# Patient Record
Sex: Female | Born: 1975
Health system: Southern US, Community
[De-identification: ages and names within clinical notes are randomized; demographics above are authoritative.]

## PROBLEM LIST (undated history)

## (undated) DIAGNOSIS — N739 Female pelvic inflammatory disease, unspecified: Secondary | ICD-10-CM

## (undated) DIAGNOSIS — K219 Gastro-esophageal reflux disease without esophagitis: Secondary | ICD-10-CM

## (undated) DIAGNOSIS — E668 Other obesity: Secondary | ICD-10-CM

## (undated) DIAGNOSIS — E669 Obesity, unspecified: Secondary | ICD-10-CM

## (undated) DIAGNOSIS — N946 Dysmenorrhea, unspecified: Secondary | ICD-10-CM

## (undated) HISTORY — DX: Obesity, unspecified: E66.9

## (undated) HISTORY — PX: WISDOM TOOTH EXTRACTION: SHX21

## (undated) HISTORY — PX: CHOLECYSTECTOMY: SHX55

## (undated) HISTORY — DX: Other obesity: E66.8

---

## 2003-05-09 ENCOUNTER — Encounter: Admission: RE | Admit: 2003-05-09 | Discharge: 2003-05-09 | Payer: Self-pay | Admitting: Obstetrics and Gynecology

## 2003-05-09 ENCOUNTER — Encounter (INDEPENDENT_AMBULATORY_CARE_PROVIDER_SITE_OTHER): Payer: Self-pay | Admitting: Specialist

## 2003-05-09 ENCOUNTER — Other Ambulatory Visit: Admission: RE | Admit: 2003-05-09 | Discharge: 2003-05-09 | Payer: Self-pay | Admitting: Obstetrics and Gynecology

## 2003-05-09 ENCOUNTER — Encounter (INDEPENDENT_AMBULATORY_CARE_PROVIDER_SITE_OTHER): Payer: Self-pay

## 2005-07-14 ENCOUNTER — Emergency Department (HOSPITAL_COMMUNITY): Admission: EM | Admit: 2005-07-14 | Discharge: 2005-07-15 | Payer: Self-pay | Admitting: Emergency Medicine

## 2005-07-26 ENCOUNTER — Encounter: Admission: RE | Admit: 2005-07-26 | Discharge: 2005-07-26 | Payer: Self-pay | Admitting: *Deleted

## 2005-09-29 ENCOUNTER — Encounter: Payer: Self-pay | Admitting: Cardiology

## 2005-09-29 ENCOUNTER — Ambulatory Visit: Payer: Self-pay

## 2007-04-24 ENCOUNTER — Emergency Department (HOSPITAL_COMMUNITY): Admission: EM | Admit: 2007-04-24 | Discharge: 2007-04-24 | Payer: Self-pay | Admitting: Emergency Medicine

## 2010-09-27 NOTE — Group Therapy Note (Signed)
NAME:  Latoya Brooks, Latoya Brooks                            ACCOUNT NO.:  [id number]   MEDICAL RECORD NO.:  [id number]                   PATIENT TYPE:  OUT   LOCATION:  [facility]                           FACILITY:  [facility]   PHYSICIAN:  Argentina Donovan, MD                     DATE OF BIRTH:  [birth year]   DATE OF SERVICE:                                    CLINIC NOTE   The patient was here for colposcopy.  She is a 35 year old, gravida 3 para 2-  0-1-2, who had normal cytology in 2002 and 04/2001 she had an ASCUS which  was followed in 06/2002 by an LSIL and a colposcopy.  At that time, they  told her to repeat the Pap smear in six months which she did, and at this  point she has ASC-H (atypical squamous cell _________ high grade squamous  intraepithelial) lesion, and was sent to [place] for re-colposcopy.  The patient  is a smoker of one pack of cigarettes a day.  We discussed stopping.   VITAL SIGNS:  She is 5 foot 6, weighs 230 pounds.  She has a blood pressure  of 121/75 and a pulse of 80.   MEDICATIONS:  She is on Depo-Provera for contraception.   Colposcopy was done under which endocervical curettage was taken as well as  biopsies at 11, 12 and 1 o'clock.  The cervix was treated with 3% acetic  acid and it was a satisfactory colposcopy where the transition cell was seen  at 360 degrees.  There were acetowhitening changes around the entire cervix  with mild fine mosaic changes at 12 o'clock and 11 o'clock.  No punctations  and no atypical vessels were visualized.   IMPRESSION:  Cervical intraepithelial neoplasia-1.   We have told the patient we will call her with the results when they return.                                               Argentina Donovan, MD    PR/MEDQ  D:  05/09/2003  T:  05/09/2003  Job:  [id number]

## 2011-11-05 ENCOUNTER — Encounter (HOSPITAL_COMMUNITY): Payer: Self-pay

## 2011-11-05 ENCOUNTER — Emergency Department (HOSPITAL_COMMUNITY)
Admission: EM | Admit: 2011-11-05 | Discharge: 2011-11-05 | Disposition: A | Discharge status: Home / Self Care | Payer: 59 | Source: Home / Self Care

## 2011-11-05 DIAGNOSIS — J42 Unspecified chronic bronchitis: Secondary | ICD-10-CM

## 2011-11-05 DIAGNOSIS — F17200 Nicotine dependence, unspecified, uncomplicated: Secondary | ICD-10-CM

## 2011-11-05 DIAGNOSIS — K219 Gastro-esophageal reflux disease without esophagitis: Secondary | ICD-10-CM

## 2011-11-05 DIAGNOSIS — R0789 Other chest pain: Secondary | ICD-10-CM

## 2011-11-05 LAB — POCT I-STAT, CHEM 8
BUN: 8 mg/dL (ref 6–23)
Calcium, Ion: 1.16 mmol/L (ref 1.12–1.32)
Chloride: 107 meq/L (ref 96–112)
Creatinine, Ser: 0.7 mg/dL (ref 0.50–1.10)
Glucose, Bld: 81 mg/dL (ref 70–99)
HCT: 44 % (ref 36.0–46.0)
Hemoglobin: 15 g/dL (ref 12.0–15.0)
Potassium: 4.1 meq/L (ref 3.5–5.1)
Sodium: 140 meq/L (ref 135–145)
TCO2: 21 mmol/L (ref 0–100)

## 2011-11-05 MED ORDER — PANTOPRAZOLE SODIUM 40 MG PO TBEC: 40.0000 mg | DELAYED_RELEASE_TABLET | Freq: Every day | ORAL | Status: DC

## 2011-11-05 MED ORDER — NICOTINE 14 MG/24HR TD PT24: 1.0000 | MEDICATED_PATCH | TRANSDERMAL | Status: AC

## 2011-11-05 MED ORDER — NICOTINE 7 MG/24HR TD PT24: 1.0000 | MEDICATED_PATCH | TRANSDERMAL | Status: AC

## 2011-11-05 NOTE — Discharge Instructions (Signed)
Gastritis Gastritis is an inflammation (the body's way of reacting to injury and/or infection) of the stomach. It is often caused by viral or bacterial (germ) infections. It can also be caused by chemicals (including alcohol) and medications. This illness may be associated with generalized malaise (feeling tired, not well), cramps, and fever. The illness may last 2 to 7 days. If symptoms of gastritis continue, gastroscopy (looking into the stomach with a telescope-like instrument), biopsy (taking tissue samples), and/or blood tests may be necessary to determine the cause. Antibiotics will not affect the illness unless there is a bacterial infection present. One common bacterial cause of gastritis is an organism known as H. Pylori. This can be treated with antibiotics. Other forms of gastritis are caused by too much acid in the stomach. They can be treated with medications such as H2 blockers and antacids. Home treatment is usually all that is needed. Young children will quickly become dehydrated (loss of body fluids) if vomiting and diarrhea are both present. Medications may be given to control nausea. Medications are usually not given for diarrhea unless especially bothersome. Some medications slow the removal of the virus from the gastrointestinal tract. This slows down the healing process. HOME CARE INSTRUCTIONS Home care instructions for nausea and vomiting:  For adults: drink small amounts of fluids often. Drink at least 2 quarts a day. Take sips frequently. Do not drink large amounts of fluid at one time. This may worsen the nausea.   Only take over-the-counter or prescription medicines for pain, discomfort, or fever as directed by your caregiver.   Drink clear liquids only. Those are anything you can see through such as water, broth, or soft drinks.   Once you are keeping clear liquids down, you may start full liquids, soups, juices, and ice cream or sherbet. Slowly add bland (plain, not spicy)  foods to your diet.  Home care instructions for diarrhea:  Diarrhea can be caused by bacterial infections or a virus. Your condition should improve with time, rest, fluids, and/or anti-diarrheal medication.   Until your diarrhea is under control, you should drink clear liquids often in small amounts. Clear liquids include: water, broth, jell-o water and weak tea.  Avoid:  Milk.   Fruits.   Tobacco.   Alcohol.   Extremely hot or cold fluids.   Too much intake of anything at one time.  When your diarrhea stops you may add the following foods, which help the stool to become more formed:  Rice.   Bananas.   Apples without skin.   Dry toast.  Once these foods are tolerated you may add low-fat yogurt and low-fat cottage cheese. They will help to restore the normal bacterial balance in your bowel. Wash your hands well to avoid spreading bacteria (germ) or virus. SEEK IMMEDIATE MEDICAL CARE IF:   You are unable to keep fluids down.   Vomiting or diarrhea become persistent (constant).   Abdominal pain develops, increases, or localizes. (Right sided pain can be appendicitis. Left sided pain in adults can be diverticulitis.)   You develop a fever (an oral temperature above 102 F (38.9 C)).   Diarrhea becomes excessive or contains blood or mucus.   You have excessive weakness, dizziness, fainting or extreme thirst.   You are not improving or you are getting worse.   You have any other questions or concerns.  Document Released: 04/22/2001 Document Revised: 04/17/2011 Document Reviewed: 04/28/2005 [facility] Patient Information 2012 [place], [place].Chronic Obstructive Pulmonary Disease Exacerbation  Chronic obstructive pulmonary disease (  COPD) is a lung disease. The lungs become damaged, making it hard to get air in and out of your lungs. COPD exacerbation means that your COPD has gotten worse. If you do not get help, this can be a life-threatening problem. HOME CARE  Do not  smoke.   Avoid tobacco smoke and other things that bother your lungs.   If given, take your antibiotic medicine as told. Finish the medicine even if you start to feel better.   Only take medicines as told by your doctor.   Drink enough fluids to keep your pee (urine) clear or pale yellow.   Use a cool mist machine (vaporizer).   If you use oxygen or a machine that turns liquid medicine into a mist (nebulizer), continue to use them as told.   Keep up with shots (vaccinations) as told by your doctor.   Exercise regularly.   Eat healthy foods.   Keep all doctor visits as told.  GET HELP RIGHT AWAY IF:  You are very short of breath.   You have trouble talking.   You have bad chest pain.   You have blood in your spit (sputum).   You have a fever, or you keep throwing up (vomiting).   You feel weak, or you pass out (faint).   You feel confused.   You keep getting worse.  MAKE SURE YOU:   Understand these instructions.   Will watch your condition.   Will get help right away if you are not doing well or get worse.  Document Released: 04/17/2011 Document Reviewed: 04/15/2011 [facility] Patient Information 2012 [place], [place].

## 2011-11-05 NOTE — ED Notes (Signed)
3-4 week duration of cough w clear/yellow secretions ; hard sometimes to breathe, cannot "keep " breath (comes and goes ) concerned she may be having a panic attack attack; ?gas, indigestion?, "I want to be checked out"; NAD

## 2011-11-05 NOTE — ED Provider Notes (Signed)
History     CSN: [id number]  Arrival date & time 11/05/11  1125   First MD Initiated Contact with Patient 11/05/11 1127      Chief Complaint  Patient presents with  . Cough     HPI  Left chest ache for 3 weeks on an off. Often occuring at rest, improves when she coughs and clears the congestion in her throat. Does not occur on exertion- walks regularly and walked 4 miles yesterday without pain. 4/10 in severity. No associated nausea, diaphoresis or dyspnea. Feels like her heart may be beating fast but has not checked.  No radiation. Usually does not last more than a few minutes but lasted for 15 min today and therefore she came to be evaluated. She felt anxious and was crying.  She has had more cough w/ congestion and clear mucous lately. (smokes)  History reviewed. No pertinent past medical history.  Past Surgical History  Procedure Date  . Cholecystectomy     History reviewed. No pertinent family history.  History  Substance Use Topics  . Smoking status: Current Some Day Smoker- 1/2 ppd for about 20 yrs.   . Smokeless tobacco: Not on file  . Alcohol Use: Yes    OB History    Grav Para Term Preterm Abortions TAB SAB Ect Mult Living                  Review of Systems  Constitutional: Negative for chills, diaphoresis, appetite change and fatigue.  HENT: Negative for hearing loss, ear pain, nosebleeds, congestion, rhinorrhea, sneezing, neck stiffness, postnasal drip, tinnitus and ear discharge.   Eyes: Negative.   Respiratory: Positive for cough and chest tightness. Negative for choking, shortness of breath, wheezing and stridor.   Cardiovascular: Positive for chest pain and palpitations. Negative for leg swelling.       Left chest tightness, once specific spot next to sternum  Gastrointestinal: Negative for nausea, abdominal pain, diarrhea, constipation and abdominal distention.       Heartburn, this chest pain feels better after burping.  Digestive problems since  she had her gall bladder removed. Need to drink a carbonated liquid at meals to prevent severe heart burn. At times has had reflux.  Has tried tums.   Genitourinary: Negative.   Musculoskeletal: Negative.   Skin: Negative.   Neurological: Positive for headaches. Negative for dizziness.       Frontal- past couple of days, "like people are getting on my nerves"  Hematological: Negative.   Psychiatric/Behavioral: Positive for agitation. Negative for hallucinations.       Nervous, stressed "all the time", a lot to do, full time job and single mom    Allergies  Review of patient's allergies indicates no known allergies.  Home Medications   Current Outpatient Rx  Name Route Sig Dispense Refill  . NICOTINE 14 MG/24HR TD PT24 Transdermal Place 1 patch onto the skin daily. 21 patch 0  . NICOTINE 7 MG/24HR TD PT24 Transdermal Place 1 patch onto the skin daily. 21 patch 0  . PANTOPRAZOLE SODIUM 40 MG PO TBEC Oral Take 1 tablet (40 mg total) by mouth daily. 30 tablet 3    BP 117/78  Pulse 65  Temp 98.5 F (36.9 C) (Oral)  Resp 20  SpO2 100%  LMP 10/23/2011  Physical Exam  Constitutional: She is oriented to person, place, and time. She appears well-developed and well-nourished. She appears distressed.  HENT:  Head: Normocephalic.  Mouth/Throat: Oropharynx is clear and moist.  Eyes: Conjunctivae and EOM are normal. Pupils are equal, round, and reactive to light. Right eye exhibits no discharge. Left eye exhibits no discharge. No scleral icterus.  Neck: Normal range of motion.  Cardiovascular: Normal rate, regular rhythm and normal heart sounds.   No murmur heard. Pulmonary/Chest: Effort normal and breath sounds normal. No stridor. No respiratory distress. She has no wheezes. She has no rales. She exhibits no tenderness.  Abdominal: Soft. Bowel sounds are normal.  Musculoskeletal: Normal range of motion.       No reproducible tenderness in chest, abdomen or back.   Lymphadenopathy:     She has no cervical adenopathy.  Neurological: She is alert and oriented to person, place, and time.  Skin: Skin is warm and dry. She is not diaphoretic.  Psychiatric:       Anxious, has been crying and worried about this pain    ED Course  Procedures (including critical care time)    Results for orders placed during the hospital encounter of 11/05/11  POCT I-STAT, CHEM 8      Component Value Range   Sodium 140  135 - 145 mEq/L   Potassium 4.1  3.5 - 5.1 mEq/L   Chloride 107  96 - 112 mEq/L   BUN 8  6 - 23 mg/dL   Creatinine, Ser 1.61  0.50 - 1.10 mg/dL   Glucose, Bld 81  70 - 99 mg/dL   Calcium, Ion 0.96  0.45 - 1.32 mmol/L   TCO2 21  0 - 100 mmol/L   Hemoglobin 15.0  12.0 - 15.0 g/dL   HCT 40.9  81.1 - 91.4 %    1. Chest pain, atypical   2. GERD (gastroesophageal reflux disease)   3. Smoker   4. Bronchitis, chronic       MDM  Chest pain - above labs done EKG reviewed- sinus brady without any other abnormality.  - may be anxiety vs Gastritis (see ROS)  Given script for protonix.  She Does not want any medication for anxiety just yet.  Smoking cessation        Calvert Cantor, MD 11/05/11 1609

## 2012-05-12 NOTE — L&D Delivery Note (Signed)
SVD of VFI at 2132 on 02/10/2013, wt and cord pH pending.  Head delivered LOA followed by atraumatic delivery of body.  Mouth and nose bulb suctioned.  Cord clamped, cut and baby to abdomen.  Cord pH obtained.  Placenta delivered S/I/3VC.  Fundus firmed with pitocin and massage.  Superficial perineal laceration; hemostatic.  Mom and baby stable.

## 2012-08-24 ENCOUNTER — Other Ambulatory Visit: Payer: Self-pay

## 2012-09-10 LAB — OB RESULTS CONSOLE ABO/RH: RH Type: POSITIVE

## 2012-09-10 LAB — OB RESULTS CONSOLE RPR: RPR: NONREACTIVE

## 2012-09-10 LAB — OB RESULTS CONSOLE GC/CHLAMYDIA
Chlamydia: NEGATIVE
Gonorrhea: NEGATIVE

## 2012-09-10 LAB — OB RESULTS CONSOLE ANTIBODY SCREEN: Antibody Screen: NEGATIVE

## 2012-10-14 ENCOUNTER — Other Ambulatory Visit (HOSPITAL_COMMUNITY): Payer: Self-pay | Admitting: Obstetrics and Gynecology

## 2012-10-14 DIAGNOSIS — IMO0002 Reserved for concepts with insufficient information to code with codable children: Secondary | ICD-10-CM

## 2012-10-14 DIAGNOSIS — Z0489 Encounter for examination and observation for other specified reasons: Secondary | ICD-10-CM

## 2012-10-15 ENCOUNTER — Encounter (HOSPITAL_COMMUNITY): Payer: Self-pay | Admitting: Obstetrics and Gynecology

## 2012-10-22 ENCOUNTER — Ambulatory Visit (HOSPITAL_COMMUNITY)
Admission: RE | Admit: 2012-10-22 | Discharge: 2012-10-22 | Disposition: A | Discharge status: Home / Self Care | Payer: 59 | Source: Ambulatory Visit | Attending: Obstetrics and Gynecology | Admitting: Obstetrics and Gynecology

## 2012-10-22 ENCOUNTER — Encounter (HOSPITAL_COMMUNITY): Payer: Self-pay

## 2012-10-22 VITALS — BP 123/65 | HR 83 | Wt 234.0 lb

## 2012-10-22 DIAGNOSIS — Z1389 Encounter for screening for other disorder: Secondary | ICD-10-CM | POA: Insufficient documentation

## 2012-10-22 DIAGNOSIS — Z8751 Personal history of pre-term labor: Secondary | ICD-10-CM | POA: Insufficient documentation

## 2012-10-22 DIAGNOSIS — O99330 Smoking (tobacco) complicating pregnancy, unspecified trimester: Secondary | ICD-10-CM | POA: Insufficient documentation

## 2012-10-22 DIAGNOSIS — O358XX0 Maternal care for other (suspected) fetal abnormality and damage, not applicable or unspecified: Secondary | ICD-10-CM | POA: Insufficient documentation

## 2012-10-22 DIAGNOSIS — O09529 Supervision of elderly multigravida, unspecified trimester: Secondary | ICD-10-CM | POA: Insufficient documentation

## 2012-10-22 DIAGNOSIS — IMO0002 Reserved for concepts with insufficient information to code with codable children: Secondary | ICD-10-CM

## 2012-10-22 DIAGNOSIS — Z0489 Encounter for examination and observation for other specified reasons: Secondary | ICD-10-CM

## 2012-10-22 DIAGNOSIS — Z363 Encounter for antenatal screening for malformations: Secondary | ICD-10-CM | POA: Insufficient documentation

## 2012-10-22 NOTE — Progress Notes (Signed)
[facility]  was seen today for an ultrasound appointment.  See full report in [facility].  Comments: An echogenic focus was seen in the left cardiac ventricle.  This is felt to represent a calcified papillary muscle, and is not associated with structural or functional cardiac abnormalities.  Although an echogenic cardiac focus may be associated with an increased risk of Down syndrome, this risk is felt to be minimal, especially when it is seen as an isolated finding.    Additionally, the patient underwent cell free fetal DNA that returned low risk for aneuploidy.  Impression: Single IUP at 24 0/7 weeks Echogenic intracardiac focus was noted in the left ventricle No other markers associated with aneuploidy were noted The remainder of the fetal anatomy was within normal limits Fetal growth is appropriate (46th %tile) Normal amniotic fluid volume  Recommendations:  Follow-up ultrasounds as clinically indicated.   Alpha Gula, MD

## 2012-11-26 ENCOUNTER — Other Ambulatory Visit: Payer: Self-pay | Admitting: Obstetrics & Gynecology

## 2012-11-26 DIAGNOSIS — N6320 Unspecified lump in the left breast, unspecified quadrant: Secondary | ICD-10-CM

## 2012-12-01 ENCOUNTER — Ambulatory Visit
Admission: RE | Admit: 2012-12-01 | Discharge: 2012-12-01 | Disposition: A | Discharge status: Home / Self Care | Payer: 59 | Source: Ambulatory Visit | Attending: Obstetrics & Gynecology | Admitting: Obstetrics & Gynecology

## 2012-12-01 ENCOUNTER — Other Ambulatory Visit: Payer: Self-pay | Admitting: Obstetrics & Gynecology

## 2012-12-01 DIAGNOSIS — N6320 Unspecified lump in the left breast, unspecified quadrant: Secondary | ICD-10-CM

## 2013-02-10 ENCOUNTER — Inpatient Hospital Stay (HOSPITAL_COMMUNITY)
Admission: AD | Admit: 2013-02-10 | Discharge: 2013-02-12 | DRG: 775 | Disposition: A | Discharge status: Home / Self Care | Payer: 59 | Source: Ambulatory Visit | Attending: Obstetrics & Gynecology | Admitting: Obstetrics & Gynecology

## 2013-02-10 ENCOUNTER — Encounter (HOSPITAL_COMMUNITY): Payer: Self-pay | Admitting: Family

## 2013-02-10 DIAGNOSIS — O99334 Smoking (tobacco) complicating childbirth: Secondary | ICD-10-CM | POA: Diagnosis present

## 2013-02-10 DIAGNOSIS — O09529 Supervision of elderly multigravida, unspecified trimester: Secondary | ICD-10-CM | POA: Diagnosis present

## 2013-02-10 DIAGNOSIS — L732 Hidradenitis suppurativa: Secondary | ICD-10-CM | POA: Diagnosis present

## 2013-02-10 LAB — CBC
MCH: 27.7 pg (ref 26.0–34.0)
MCHC: 34.3 g/dL (ref 30.0–36.0)
MCV: 80.8 fL (ref 78.0–100.0)
Platelets: 274 10*3/uL (ref 150–400)
RBC: 4.48 MIL/uL (ref 3.87–5.11)

## 2013-02-10 LAB — TYPE AND SCREEN
ABO/RH(D): A POS
Antibody Screen: NEGATIVE

## 2013-02-10 LAB — POCT FERN TEST: POCT Fern Test: POSITIVE

## 2013-02-10 LAB — URINALYSIS, ROUTINE W REFLEX MICROSCOPIC
Bilirubin Urine: NEGATIVE
Glucose, UA: NEGATIVE mg/dL
Ketones, ur: NEGATIVE mg/dL
Leukocytes, UA: NEGATIVE
Nitrite: NEGATIVE
Protein, ur: NEGATIVE mg/dL
Specific Gravity, Urine: 1.015 (ref 1.005–1.030)
Urobilinogen, UA: 0.2 mg/dL (ref 0.0–1.0)
pH: 6 (ref 5.0–8.0)

## 2013-02-10 LAB — URINE MICROSCOPIC-ADD ON

## 2013-02-10 LAB — ABO/RH: ABO/RH(D): A POS

## 2013-02-10 MED ORDER — OXYCODONE-ACETAMINOPHEN 5-325 MG PO TABS
1.0000 | ORAL_TABLET | ORAL | Status: DC | PRN
  Administered 2013-02-10: 1 via ORAL
  Filled 2013-02-10: qty 1

## 2013-02-10 MED ORDER — ONDANSETRON HCL 4 MG/2ML IJ SOLN: 4.0000 mg | Freq: Four times a day (QID) | INTRAMUSCULAR | Status: DC | PRN

## 2013-02-10 MED ORDER — IBUPROFEN 600 MG PO TABS
600.0000 mg | ORAL_TABLET | Freq: Four times a day (QID) | ORAL | Status: DC | PRN
  Administered 2013-02-10: 600 mg via ORAL
  Filled 2013-02-10: qty 1

## 2013-02-10 MED ORDER — FLEET ENEMA 7-19 GM/118ML RE ENEM: 1.0000 | ENEMA | RECTAL | Status: DC | PRN

## 2013-02-10 MED ORDER — CITRIC ACID-SODIUM CITRATE 334-500 MG/5ML PO SOLN: 30.0000 mL | ORAL | Status: DC | PRN

## 2013-02-10 MED ORDER — OXYTOCIN BOLUS FROM INFUSION
500.0000 mL | INTRAVENOUS | Status: DC
  Administered 2013-02-10: 500 mL via INTRAVENOUS

## 2013-02-10 MED ORDER — LACTATED RINGERS IV SOLN: 500.0000 mL | INTRAVENOUS | Status: DC | PRN

## 2013-02-10 MED ORDER — OXYTOCIN 40 UNITS IN LACTATED RINGERS INFUSION - SIMPLE MED
62.5000 mL/h | INTRAVENOUS | Status: DC
  Filled 2013-02-10: qty 1000

## 2013-02-10 MED ORDER — LACTATED RINGERS IV SOLN: INTRAVENOUS | Status: DC

## 2013-02-10 MED ORDER — LIDOCAINE HCL (PF) 1 % IJ SOLN
30.0000 mL | INTRAMUSCULAR | Status: DC | PRN
  Filled 2013-02-10 (×2): qty 30

## 2013-02-10 MED ORDER — ACETAMINOPHEN 325 MG PO TABS: 650.0000 mg | ORAL_TABLET | ORAL | Status: DC | PRN

## 2013-02-10 NOTE — MAU Note (Signed)
37 yo, G5P2 at [redacted]w[redacted]d, presents to MAU with questionable ROM at 0830 today. Reports clear/yellowish fluid. +FM; denies VB, contractions. Denies HSV.

## 2013-02-10 NOTE — H&P (Signed)
Latoya Brooks is a 37 y.o. female presenting for labor.  SROM at 0800 but no CTX until 3-4pm.  She reports good FM and no VB.  Antepartum course is complicated by AMA with negative MaterniT21, smoker, h/o PTD at 36 weeks, h/o hidradenitis.  GBS negative.  Maternal Medical History:  Reason for admission: Rupture of membranes.   Contractions: Onset was 3-5 hours ago.   Frequency: regular.   Perceived severity is moderate.    Fetal activity: Perceived fetal activity is normal.   Last perceived fetal movement was within the past hour.    Prenatal complications: no prenatal complications Prenatal Complications - Diabetes: none.    OB History   Grav Para Term Preterm Abortions TAB SAB Ect Mult Living   5 2 1 1 2 2  0 0 0 2     Past Medical History  Diagnosis Date  . Preterm labor    Past Surgical History  Procedure Laterality Date  . Cholecystectomy     Family History: family history is not on file. Social History:  reports that she has been smoking.  She does not have any smokeless tobacco history on file. She reports that  drinks alcohol. Her drug history is not on file.   Prenatal Transfer Tool  Maternal Diabetes: No Genetic Screening: Normal Maternal Ultrasounds/Referrals: Normal Fetal Ultrasounds or other Referrals:  None Maternal Substance Abuse:  No Significant Maternal Medications:  None Significant Maternal Lab Results:  Lab values include: Group B Strep negative Other Comments:  None  ROS  Dilation: 3 Effacement (%): 100 Station: -3 Exam by:: C. Millican, RN  Blood pressure 135/53, pulse 88, temperature 98 F (36.7 C), temperature source Oral, resp. rate 20, height 5\' 6"  (1.676 m), weight 251 lb (113.853 kg), last menstrual period 04/22/2012. Maternal Exam:  Uterine Assessment: Contraction strength is moderate.  Contraction frequency is regular.   Abdomen: Fundal height is c/w dates.   Estimated fetal weight is 7#12.   Fetal presentation:  vertex     Physical Exam  Constitutional: She is oriented to person, place, and time. She appears well-developed and well-nourished.  GI: Soft. There is no tenderness. There is no rebound and no guarding.  Neurological: She is alert and oriented to person, place, and time.  Skin: Skin is warm and dry.  Psychiatric: She has a normal mood and affect. Her behavior is normal.    Prenatal labs: ABO, Rh: A/Positive/-- (05/02 0000) Antibody: Negative (05/02 0000) Rubella:   RPR: Nonreactive (05/02 0000)  HBsAg: Negative (05/02 0000)  HIV: Non-reactive (05/02 0000)  GBS: Negative (09/03 0000)   Assessment/Plan: 36yo [id number] at [redacted]w[redacted]d -Epidural if desired -Anticipate SVD -GBS neg   Baudelio Karnes 02/10/2013, 7:04 PM

## 2013-02-11 LAB — CBC
HCT: 34.4 % — ABNORMAL LOW (ref 36.0–46.0)
Hemoglobin: 11.8 g/dL — ABNORMAL LOW (ref 12.0–15.0)
MCH: 27.6 pg (ref 26.0–34.0)
MCV: 80.6 fL (ref 78.0–100.0)
Platelets: 236 10*3/uL (ref 150–400)
RBC: 4.27 MIL/uL (ref 3.87–5.11)
WBC: 12.1 10*3/uL — ABNORMAL HIGH (ref 4.0–10.5)

## 2013-02-11 LAB — RPR: RPR Ser Ql: NONREACTIVE

## 2013-02-11 MED ORDER — IBUPROFEN 600 MG PO TABS
600.0000 mg | ORAL_TABLET | Freq: Four times a day (QID) | ORAL | Status: DC
  Administered 2013-02-11 – 2013-02-12 (×6): 600 mg via ORAL
  Filled 2013-02-11 (×6): qty 1

## 2013-02-11 MED ORDER — ONDANSETRON HCL 4 MG/2ML IJ SOLN: 4.0000 mg | INTRAMUSCULAR | Status: DC | PRN

## 2013-02-11 MED ORDER — LANOLIN HYDROUS EX OINT: TOPICAL_OINTMENT | CUTANEOUS | Status: DC | PRN

## 2013-02-11 MED ORDER — DIBUCAINE 1 % RE OINT: 1.0000 "application " | TOPICAL_OINTMENT | RECTAL | Status: DC | PRN

## 2013-02-11 MED ORDER — TETANUS-DIPHTH-ACELL PERTUSSIS 5-2.5-18.5 LF-MCG/0.5 IM SUSP: 0.5000 mL | Freq: Once | INTRAMUSCULAR | Status: DC

## 2013-02-11 MED ORDER — PRENATAL MULTIVITAMIN CH
1.0000 | ORAL_TABLET | Freq: Every day | ORAL | Status: DC
  Administered 2013-02-12: 1 via ORAL
  Filled 2013-02-11 (×2): qty 1

## 2013-02-11 MED ORDER — OXYCODONE-ACETAMINOPHEN 5-325 MG PO TABS
1.0000 | ORAL_TABLET | ORAL | Status: DC | PRN
  Administered 2013-02-11 (×4): 1 via ORAL
  Filled 2013-02-11 (×4): qty 1

## 2013-02-11 MED ORDER — ZOLPIDEM TARTRATE 5 MG PO TABS: 5.0000 mg | ORAL_TABLET | Freq: Every evening | ORAL | Status: DC | PRN

## 2013-02-11 MED ORDER — WITCH HAZEL-GLYCERIN EX PADS: 1.0000 "application " | MEDICATED_PAD | CUTANEOUS | Status: DC | PRN

## 2013-02-11 MED ORDER — SENNOSIDES-DOCUSATE SODIUM 8.6-50 MG PO TABS
2.0000 | ORAL_TABLET | ORAL | Status: DC
  Administered 2013-02-12: 2 via ORAL

## 2013-02-11 MED ORDER — DIPHENHYDRAMINE HCL 25 MG PO CAPS: 25.0000 mg | ORAL_CAPSULE | Freq: Four times a day (QID) | ORAL | Status: DC | PRN

## 2013-02-11 MED ORDER — SIMETHICONE 80 MG PO CHEW: 80.0000 mg | CHEWABLE_TABLET | ORAL | Status: DC | PRN

## 2013-02-11 MED ORDER — BENZOCAINE-MENTHOL 20-0.5 % EX AERO
1.0000 "application " | INHALATION_SPRAY | CUTANEOUS | Status: DC | PRN
  Administered 2013-02-11: 1 via TOPICAL
  Filled 2013-02-11: qty 56

## 2013-02-11 MED ORDER — ONDANSETRON HCL 4 MG PO TABS: 4.0000 mg | ORAL_TABLET | ORAL | Status: DC | PRN

## 2013-02-11 NOTE — Progress Notes (Signed)
Post Partum Day 1 Subjective: no complaints, up ad lib, voiding, tolerating PO and + flatus  Objective: Blood pressure 121/64, pulse 71, temperature 98.4 F (36.9 C), temperature source Oral, resp. rate 18, height 5\' 6"  (1.676 m), weight 251 lb (113.853 kg), last menstrual period 04/22/2012, SpO2 100.00%, unknown if currently breastfeeding.  Physical Exam:  General: alert, cooperative and no distress Lochia: appropriate Uterine Fundus: firm Incision: healing well DVT Evaluation: No evidence of DVT seen on physical exam.   Recent Labs  02/10/13 1840 02/11/13 0605  HGB 12.4 11.8*  HCT 36.2 34.4*    Assessment/Plan: Plan for discharge tomorrow   LOS: 1 day   Dowell Hoon II,Dashea Mcmullan E 02/11/2013, 9:22 AM

## 2013-02-11 NOTE — Lactation Note (Signed)
This note was copied from the chart of Latoya [facility]. Lactation Consultation Note Mom chooses to breast feed and formula feed baby. Mom had baby in a cradle hold on the right; shallow latch, mom states no pain. Offered to assist with getting a deeper latch, mom accepts. Demonstrated to mom how to use cross cradle and chin tug to get a deeper latch. Baby has rhythmic sucking and audible swallowing.  Mom is concerned about an abscess in the left breast. Mom states that she has been treated with antibiotics and drainage, and the abscess went away, and just came back (smaller than before.)   Mom states her doctor told her it is ok to breast feed on that side but she is not comfortable doing so because she is not sure the milk on that side is ok, and that the abscess will not affect the quality of the milk. I reinforced to mom that the milk on the left is safe for her baby; but mom really does not want to breast feed baby from the left. Mom states she wants to breast feed baby from the right, and pump the left.   Br feeding basics reviewed with mom. Mom provided with lactation brochure and community resources. Mom enc to attend the BFSG.  Enc mom to call for assistance if she has any concerns.   Patient Name: Latoya Brooks Today's Date: 02/11/2013 Reason for consult: Initial assessment   Maternal Data Formula Feeding for Exclusion: Yes Reason for exclusion: Mother's choice to formula and breast feed on admission Has patient been taught Hand Expression?: Yes Does the patient have breastfeeding experience prior to this delivery?: No (Did not breast feed her first 2 children, now 13 and 15 year)  Feeding Feeding Type: Breast Milk Length of feed: 30 min  LATCH Score/Interventions Latch: Grasps breast easily, tongue down, lips flanged, rhythmical sucking.  Audible Swallowing: A few with stimulation  Type of Nipple: Everted at rest and after stimulation  Comfort (Breast/Nipple): Soft /  non-tender     Hold (Positioning): Assistance needed to correctly position infant at breast and maintain latch. Intervention(s): Position options;Breastfeeding basics reviewed;Support Pillows;Skin to skin  LATCH Score: 8  Lactation Tools Discussed/Used     Consult Status Consult Status: PRN    Lenard Forth 02/11/2013, 12:46 PM

## 2013-02-12 MED ORDER — IBUPROFEN 600 MG PO TABS: 600.0000 mg | ORAL_TABLET | Freq: Four times a day (QID) | ORAL | Status: DC | PRN

## 2013-02-12 MED ORDER — OXYCODONE-ACETAMINOPHEN 5-325 MG PO TABS: 1.0000 | ORAL_TABLET | Freq: Four times a day (QID) | ORAL | Status: DC | PRN

## 2013-02-12 NOTE — Progress Notes (Signed)
Post Partum Day 2 Subjective: no complaints, up ad lib, voiding, tolerating PO and + flatus  Objective: Blood pressure 114/63, pulse 60, temperature 98.2 F (36.8 C), temperature source Oral, resp. rate 17, height 5\' 6"  (1.676 m), weight 251 lb (113.853 kg), last menstrual period 04/22/2012, SpO2 99.00%, unknown if currently breastfeeding.  Physical Exam:  General: alert, cooperative and no distress Lochia: appropriate Uterine Fundus: firm Incision: healing well DVT Evaluation: No evidence of DVT seen on physical exam.   Recent Labs  02/10/13 1840 02/11/13 0605  HGB 12.4 11.8*  HCT 36.2 34.4*    Assessment/Plan: Discharge home   LOS: 2 days   Judith Demps II,Jazper Nikolai E 02/12/2013, 8:36 AM

## 2013-02-12 NOTE — Lactation Note (Signed)
This note was copied from the chart of Latoya [facility]. Lactation Consultation Note: Follow up visit with mom before DC. Mom is latching to both breasts now. Reports that her nipples are sore- they look intact. Assisted mom with getting baby deeper onto the breast. Encouraged to keep the baby close to the breast throughout the entire feeding. Comfort gels given with instructions for use. No further questions at present. To call prn  Patient Name: Latoya Brooks Today's Date: 02/12/2013 Reason for consult: Follow-up assessment   Maternal Data    Feeding   LATCH Score/Interventions Latch: Grasps breast easily, tongue down, lips flanged, rhythmical sucking.  Audible Swallowing: A few with stimulation  Type of Nipple: Everted at rest and after stimulation  Comfort (Breast/Nipple): Filling, red/small blisters or bruises, mild/mod discomfort  Problem noted: Mild/Moderate discomfort Interventions (Mild/moderate discomfort): Comfort gels  Hold (Positioning): Assistance needed to correctly position infant at breast and maintain latch. Intervention(s): Breastfeeding basics reviewed;Support Pillows;Position options  LATCH Score: 7  Lactation Tools Discussed/Used     Consult Status Consult Status: Complete    Pamelia Hoit 02/12/2013, 9:20 AM

## 2013-02-12 NOTE — Discharge Summary (Signed)
Obstetric Discharge Summary Reason for Admission: rupture of membranes Prenatal Procedures: ultrasound Intrapartum Procedures: spontaneous vaginal delivery Postpartum Procedures: none Complications-Operative and Postpartum: none Hemoglobin  Date Value Range Status  02/11/2013 11.8* 12.0 - 15.0 g/dL Final     HCT  Date Value Range Status  02/11/2013 34.4* 36.0 - 46.0 % Final    Physical Exam:  General: alert, cooperative and no distress Lochia: appropriate Uterine Fundus: firm Incision: healing well DVT Evaluation: No evidence of DVT seen on physical exam.  Discharge Diagnoses: Term Pregnancy-delivered  Discharge Information: Date: 02/12/2013 Activity: pelvic rest Diet: routine Medications: PNV, Ibuprofen and Percocet Condition: stable Instructions: refer to practice specific booklet Discharge to: home   Newborn Data: Live born female  Birth Weight: 6 lb 3.4 oz (2818 g) APGAR: 9, 9  Home with mother.  Latoya Brooks,Latoya Brooks 02/12/2013, 4:18 PM

## 2013-02-24 ENCOUNTER — Ambulatory Visit (HOSPITAL_COMMUNITY)
Admission: RE | Admit: 2013-02-24 | Discharge: 2013-02-24 | Disposition: A | Discharge status: Home / Self Care | Payer: 59 | Source: Ambulatory Visit | Attending: Obstetrics & Gynecology | Admitting: Obstetrics & Gynecology

## 2013-02-24 NOTE — Lactation Note (Signed)
Infant Lactation Consultation Outpatient Visit Note: Here today because of slow weight gain.   Patient Name: Latoya Brooks Date of Birth: December 24, 1975 Birth Weight:   Gestational Age at Delivery: Gestational Age: <None> Type of Delivery:   Breastfeeding History Frequency of Breastfeeding: Q 1 1/2 -2 hours- acts fussy alot Length of Feeding: 45 min or longer Voids: QS Stools: QS  Supplementing / Method: Gave one bottle of formula last night because she thought the baby wasn't getting enough. Baby took 2 oz and went to sleep. Pumping:  Type of Pump: manual - has not used it because the baby was nusing so often- Plans to get one from [facility] tomorrow   Frequency:  Volume:    Comments:    Consultation Evaluation: Mom reports that she had an abcess during her pregnancy that was [facility]. Now the area is about the size of a quarter just beside her left nipple. Was prescribed antibiotics by her OB yesterday  Initial Feeding Assessment: Pre-feed Weight:5-14.4  2676g Post-feed Weight: diaper was changed without reweighing. Amount Transferred: ?? Comments: Baby latched to left breast- Few swallows noted. Baby's cheeks dimpled in. Position readjusted   Additional Feeding Assessment: Pre-feed Weight: 5-13.3  2644g Post-feed Weight: 5- 13.7  2658g Amount Transferred:14 cc's Comments: Baby latched to right breast- assisted mom with deeper latch. Mom reports no pain with nursing. Few swallows noted.   Additional Feeding Assessment: Pre-feed Weight: 5- 13.7  2658g Post-feed Weight: 5- 14.4  2678g Amount Transferred: 20 cc's Comments: Assisted mom with latch with feeding tube/ syringe to supplement baby while at the breast. Baby took 20 cc's with lots of swallows noted and more vigorous sucks. Reviewed setup and use of feeding tube/syringe. To use EBM or formula in syringe. Mom discouraged that she is not making more milk. Encouragement given.     Additional Interventions:  Encouraged to get DEBP and to pump at least 4-6 times/day to promote milk supply.    Follow-Up  To see OB tomorrow about nipple To see [facility] tomorrow about DEBP OP appointment here on Friday 03/04/2013  at 2:30 pm- follow up feeding assessment and weight check    Pamelia Hoit 02/24/2013, 3:55 PM

## 2013-03-04 ENCOUNTER — Ambulatory Visit (HOSPITAL_COMMUNITY): Payer: 59

## 2013-06-17 ENCOUNTER — Emergency Department
Admission: EM | Admit: 2013-06-17 | Discharge: 2013-06-17 | Disposition: A | Discharge status: Home / Self Care | Payer: 59 | Source: Home / Self Care | Attending: Family Medicine | Admitting: Family Medicine

## 2013-06-17 ENCOUNTER — Encounter: Payer: Self-pay | Admitting: Emergency Medicine

## 2013-06-17 DIAGNOSIS — R102 Pelvic and perineal pain: Secondary | ICD-10-CM

## 2013-06-17 DIAGNOSIS — N760 Acute vaginitis: Secondary | ICD-10-CM

## 2013-06-17 DIAGNOSIS — N949 Unspecified condition associated with female genital organs and menstrual cycle: Secondary | ICD-10-CM

## 2013-06-17 DIAGNOSIS — A499 Bacterial infection, unspecified: Secondary | ICD-10-CM

## 2013-06-17 DIAGNOSIS — B9689 Other specified bacterial agents as the cause of diseases classified elsewhere: Secondary | ICD-10-CM

## 2013-06-17 LAB — POCT WET + KOH PREP
KOH Prep POC: NEGATIVE
WBC WET PREP: 0

## 2013-06-17 LAB — POCT URINALYSIS DIP (MANUAL ENTRY)
BILIRUBIN UA: NEGATIVE
Bilirubin, UA: NEGATIVE
Blood, UA: NEGATIVE
Glucose, UA: NEGATIVE
Leukocytes, UA: NEGATIVE
Nitrite, UA: NEGATIVE
PH UA: 7 (ref 5–8)
Protein Ur, POC: NEGATIVE
Spec Grav, UA: 1.015 (ref 1.005–1.03)
UROBILINOGEN UA: 0.2 (ref 0–1)

## 2013-06-17 LAB — POCT URINE PREGNANCY: Preg Test, Ur: NEGATIVE

## 2013-06-17 MED ORDER — METRONIDAZOLE 500 MG PO TABS: 500.0000 mg | ORAL_TABLET | Freq: Two times a day (BID) | ORAL | Status: DC

## 2013-06-17 NOTE — ED Notes (Signed)
[place] c/ovaginal pain/throb without discharge. C/o mild odor and mild pelvic pressure today. Wants HCG and STD check for G/C chlamydia today.

## 2013-06-17 NOTE — Discharge Instructions (Signed)

## 2013-06-17 NOTE — ED Provider Notes (Signed)
CSN: [id number]     Arrival date & time 06/17/13  1610 History   First MD Initiated Contact with Patient 06/17/13 1745     Chief Complaint  Patient presents with  . Vaginal Pain      HPI Comments: Patient complains of one week history of vaginal odor and discomfort without significant discharge.  No pelvic or abdominal pain.  No urinary symptoms.  No fevers, chills, and sweats.  Patient's last menstrual period was 05/21/2013.   The history is provided by the patient.    Past Medical History  Diagnosis Date  . Preterm labor    Past Surgical History  Procedure Laterality Date  . Cholecystectomy     History reviewed. No pertinent family history. History  Substance Use Topics  . Smoking status: Current Some Day Smoker  . Smokeless tobacco: Never Used  . Alcohol Use: Yes   OB History   Grav Para Term Preterm Abortions TAB SAB Ect Mult Living   5 3 2 1 2 2  0 0 0 3     Review of Systems  Constitutional: Negative.   HENT: Negative.   Eyes: Negative.   Respiratory: Negative.   Cardiovascular: Negative.   Gastrointestinal: Negative.   Genitourinary: Positive for vaginal discharge and vaginal pain. Negative for dysuria, urgency, frequency, hematuria, flank pain, vaginal bleeding, difficulty urinating, genital sores and pelvic pain.  Musculoskeletal: Negative.   Skin: Negative.     Allergies  Review of patient's allergies indicates no known allergies.  Home Medications   Current Outpatient Rx  Name  Route  Sig  Dispense  Refill  . ibuprofen (ADVIL,MOTRIN) 600 MG tablet   Oral   Take 1 tablet (600 mg total) by mouth every 6 (six) hours as needed for pain.   30 tablet   2   . metroNIDAZOLE (FLAGYL) 500 MG tablet   Oral   Take 1 tablet (500 mg total) by mouth 2 (two) times daily.   14 tablet   0    BP 107/73  Pulse 73  Temp(Src) 98.2 F (36.8 C) (Oral)  Resp 14  Ht 5\' 6"  (1.676 m)  Wt 236 lb (107.049 kg)  BMI 38.11 kg/m2  SpO2 99%  LMP 05/21/2013 Physical  Exam Nursing notes and Vital Signs reviewed. Appearance:  Patient appears stated age, and in no acute distress.  Patient is obese. Eyes:  Pupils are equal, round, and reactive to light and accomodation.  Extraocular movement is intact.  Conjunctivae are not inflamed  Pharynx:  Normal Neck:  Supple.  No adenopathy Lungs:  Clear to auscultation.  Breath sounds are equal.  Heart:  Regular rate and rhythm without murmurs, rubs, or gallops.  Abdomen:  Nontender without masses or hepatosplenomegaly.  Bowel sounds are present.  No CVA or flank tenderness.  Extremities:  No edema.   Skin:  No rash present. Genitourinary:  Vulva appears normal without lesions or erythema.  Vagina has normal mucosae without lesions.   There is a small amount of  Mucoid discharge in the vaginal vault.  Cervix appears normal without lesions.  There is no discharge present in the cervical os.  No cervical motion tenderness is present.  Uterus is small and non-tender.  Adnexae are non-tender without masses.    ED Course  Procedures  None  Labs Review Labs Reviewed  GC/CHLAMYDIA PROBE AMP, GENITAL  POCT URINALYSIS DIP (MANUAL ENTRY) Negative  POCT URINE PREGNANCY Negative  KOH prep:  Negative yeast cells.  Wet prep:  Multiple  clue cells.  No WBC.  No trich       MDM   1. Vaginal pain   2. Bacterial vaginosis    GC/chlamydia pending Begin Flagyl 500mg  BID for 7 days. Followup with PCP if not improving    Lattie Haw, MD 06/20/13 [phone]

## 2013-06-18 LAB — GC/CHLAMYDIA PROBE AMP
CT Probe RNA: NEGATIVE
GC Probe RNA: NEGATIVE

## 2013-06-21 ENCOUNTER — Telehealth: Payer: Self-pay | Admitting: *Deleted

## 2013-06-21 NOTE — ED Notes (Signed)
Pt called back, password verified, results given. She reports that she is feeling better. Clemens Catholichristy Sequoia Witz, LPN

## 2013-06-24 ENCOUNTER — Encounter (HOSPITAL_COMMUNITY): Payer: Self-pay | Admitting: Pharmacist

## 2013-06-28 NOTE — H&P (Addendum)
   Latoya Brooks presents today for postpartum visit.  Dr. Langston MaskerMorris delivered baby [name].  She is interested in either a tubal ligation or an IUD. O: Physical exam:  General:  Alert and oriented.  Normocephalic and atraumatic.  No thyromegaly or masses palpated.  Heart is regular rate and rhythm without murmur.  Lungs are clear to auscultation bilaterally.  Breasts without masses, lymphadenopathy or discharge.  Abdomen is soft, nontender, nondistended.  No rebound or guarding.  No flank pain is noted.  Extremities without clubbing, cyanosis or edema.  Normal external genitalia, Bartholin, Skene's and urethra.  Vagina without lesions.  Cervix within normal limits.  Uterus is anteverted, mobile and nontender.  No adnexal masses are palpable.  No inguinal lymphadenopathy palpable.   A&P: Normal postpartum visit.  Pap smear yearly, self breast exams once a month.  Discussed mammogram.  Discussed different forms of contraception.  She wants information on both.  Will precertify both as well, either IUD or tubal ligation.  She will let us know what she desires.  In the meantime, condoms.  Multiparity and desires sterility.  Discussed risks and benefits of sterilization including but not limited to risk of tubal failure quoted as 09/998.  She gives her informed consent. Dineen Kidavid C. Rana SnareLowe, MD/rg   07/08/13 0715 This patient has been seen and examined.   All of her questions were answered.  Labs and vital signs reviewed.  Informed consent has been obtained.  The History and Physical is current. DL

## 2013-07-06 ENCOUNTER — Encounter (HOSPITAL_COMMUNITY)
Admission: RE | Admit: 2013-07-06 | Discharge: 2013-07-06 | Disposition: A | Discharge status: Home / Self Care | Payer: 59 | Source: Ambulatory Visit | Attending: Obstetrics and Gynecology | Admitting: Obstetrics and Gynecology

## 2013-07-06 ENCOUNTER — Encounter (HOSPITAL_COMMUNITY): Payer: Self-pay

## 2013-07-06 HISTORY — DX: Gastro-esophageal reflux disease without esophagitis: K21.9

## 2013-07-06 LAB — CBC
HCT: 40.9 % (ref 36.0–46.0)
Hemoglobin: 13.9 g/dL (ref 12.0–15.0)
MCH: 27.8 pg (ref 26.0–34.0)
MCHC: 34 g/dL (ref 30.0–36.0)
MCV: 81.8 fL (ref 78.0–100.0)
PLATELETS: 283 10*3/uL (ref 150–400)
RBC: 5 MIL/uL (ref 3.87–5.11)
RDW: 14 % (ref 11.5–15.5)
WBC: 4.7 10*3/uL (ref 4.0–10.5)

## 2013-07-06 NOTE — Patient Instructions (Addendum)
   Your procedure is scheduled on:  Friday, Feb 27  [facility] at:  6 AM Pick up the phone at the desk and dial [phone] and inform us of your arrival.  Please call this number if you have any problems the morning of surgery: [phone]  Remember: Do not eat or drink after midnight: Thursday Take these medicines the morning of surgery with a SIP OF WATER:  protonix  Do not wear jewelry, make-up, or FINGER nail polish No metal in your hair or on your body. Do not wear lotions, powders, perfumes.  You may wear deodorant.  Do not bring valuables to the hospital. Contacts, dentures or bridgework may not be worn into surgery.  Patients discharged on the day of surgery will not be allowed to drive home.  Home with friend Marily Lentenita Hayes cell [phone].

## 2013-07-07 MED ORDER — DEXTROSE 5 % IV SOLN
2.0000 g | INTRAVENOUS | Status: AC
  Administered 2013-07-08: 2 g via INTRAVENOUS
  Filled 2013-07-07: qty 2

## 2013-07-08 ENCOUNTER — Encounter (HOSPITAL_COMMUNITY): Payer: Self-pay | Admitting: *Deleted

## 2013-07-08 ENCOUNTER — Ambulatory Visit (HOSPITAL_COMMUNITY)
Admission: RE | Admit: 2013-07-08 | Discharge: 2013-07-08 | Disposition: A | Discharge status: Home / Self Care | Payer: 59 | Source: Ambulatory Visit | Attending: Obstetrics and Gynecology | Admitting: Obstetrics and Gynecology

## 2013-07-08 ENCOUNTER — Ambulatory Visit (HOSPITAL_COMMUNITY): Payer: 59 | Admitting: Anesthesiology

## 2013-07-08 ENCOUNTER — Encounter (HOSPITAL_COMMUNITY): Payer: 59 | Admitting: Anesthesiology

## 2013-07-08 ENCOUNTER — Encounter (HOSPITAL_COMMUNITY)
Admission: RE | Disposition: A | Discharge status: Home / Self Care | Payer: Self-pay | Source: Ambulatory Visit | Attending: Obstetrics and Gynecology

## 2013-07-08 DIAGNOSIS — F17200 Nicotine dependence, unspecified, uncomplicated: Secondary | ICD-10-CM | POA: Insufficient documentation

## 2013-07-08 DIAGNOSIS — Z9889 Other specified postprocedural states: Secondary | ICD-10-CM

## 2013-07-08 DIAGNOSIS — Z641 Problems related to multiparity: Secondary | ICD-10-CM | POA: Insufficient documentation

## 2013-07-08 DIAGNOSIS — K219 Gastro-esophageal reflux disease without esophagitis: Secondary | ICD-10-CM | POA: Insufficient documentation

## 2013-07-08 DIAGNOSIS — Z302 Encounter for sterilization: Secondary | ICD-10-CM | POA: Insufficient documentation

## 2013-07-08 HISTORY — PX: LAPAROSCOPIC TUBAL LIGATION: SHX1937

## 2013-07-08 LAB — PREGNANCY, URINE: PREG TEST UR: NEGATIVE

## 2013-07-08 SURGERY — LIGATION, FALLOPIAN TUBE, LAPAROSCOPIC
Anesthesia: General | Site: Abdomen | Laterality: Bilateral

## 2013-07-08 MED ORDER — SUCCINYLCHOLINE CHLORIDE 20 MG/ML IJ SOLN
INTRAMUSCULAR | Status: DC | PRN
  Administered 2013-07-08: 120 mg via INTRAVENOUS

## 2013-07-08 MED ORDER — PROPOFOL 10 MG/ML IV BOLUS
INTRAVENOUS | Status: DC | PRN
  Administered 2013-07-08: 180 mg via INTRAVENOUS

## 2013-07-08 MED ORDER — MIDAZOLAM HCL 2 MG/2ML IJ SOLN
INTRAMUSCULAR | Status: AC
  Filled 2013-07-08: qty 2

## 2013-07-08 MED ORDER — LACTATED RINGERS IV SOLN
INTRAVENOUS | Status: DC
  Administered 2013-07-08: 1000 mL via INTRAVENOUS
  Administered 2013-07-08: 08:00:00 via INTRAVENOUS

## 2013-07-08 MED ORDER — BUPIVACAINE HCL (PF) 0.25 % IJ SOLN
INTRAMUSCULAR | Status: AC
  Filled 2013-07-08: qty 30

## 2013-07-08 MED ORDER — FENTANYL CITRATE 0.05 MG/ML IJ SOLN
INTRAMUSCULAR | Status: AC
  Administered 2013-07-08: 50 ug via INTRAVENOUS
  Filled 2013-07-08: qty 2

## 2013-07-08 MED ORDER — FENTANYL CITRATE 0.05 MG/ML IJ SOLN
INTRAMUSCULAR | Status: AC
  Filled 2013-07-08: qty 5

## 2013-07-08 MED ORDER — ROCURONIUM BROMIDE 100 MG/10ML IV SOLN
INTRAVENOUS | Status: AC
Start: 2013-07-08 — End: 2013-07-08
  Filled 2013-07-08: qty 1

## 2013-07-08 MED ORDER — MIDAZOLAM HCL 2 MG/2ML IJ SOLN
INTRAMUSCULAR | Status: DC | PRN
  Administered 2013-07-08: 2 mg via INTRAVENOUS

## 2013-07-08 MED ORDER — HYDROCODONE-ACETAMINOPHEN 5-325 MG PO TABS: 1.0000 | ORAL_TABLET | Freq: Once | ORAL | Status: DC

## 2013-07-08 MED ORDER — KETOROLAC TROMETHAMINE 30 MG/ML IJ SOLN: 15.0000 mg | Freq: Once | INTRAMUSCULAR | Status: DC | PRN

## 2013-07-08 MED ORDER — BUPIVACAINE HCL (PF) 0.25 % IJ SOLN
INTRAMUSCULAR | Status: DC | PRN
  Administered 2013-07-08: 10 mL

## 2013-07-08 MED ORDER — GLYCOPYRROLATE 0.2 MG/ML IJ SOLN
INTRAMUSCULAR | Status: AC
  Filled 2013-07-08: qty 2

## 2013-07-08 MED ORDER — FENTANYL CITRATE 0.05 MG/ML IJ SOLN
25.0000 ug | INTRAMUSCULAR | Status: DC | PRN
  Administered 2013-07-08 (×2): 50 ug via INTRAVENOUS

## 2013-07-08 MED ORDER — PROMETHAZINE HCL 25 MG/ML IJ SOLN: 6.2500 mg | INTRAMUSCULAR | Status: DC | PRN

## 2013-07-08 MED ORDER — ROCURONIUM BROMIDE 100 MG/10ML IV SOLN
INTRAVENOUS | Status: DC | PRN
  Administered 2013-07-08: 10 mg via INTRAVENOUS
  Administered 2013-07-08: 5 mg via INTRAVENOUS

## 2013-07-08 MED ORDER — FENTANYL CITRATE 0.05 MG/ML IJ SOLN
INTRAMUSCULAR | Status: DC | PRN
  Administered 2013-07-08: 50 ug via INTRAVENOUS
  Administered 2013-07-08: 100 ug via INTRAVENOUS
  Administered 2013-07-08 (×2): 50 ug via INTRAVENOUS

## 2013-07-08 MED ORDER — LIDOCAINE HCL (CARDIAC) 20 MG/ML IV SOLN
INTRAVENOUS | Status: AC
  Filled 2013-07-08: qty 5

## 2013-07-08 MED ORDER — NEOSTIGMINE METHYLSULFATE 1 MG/ML IJ SOLN
INTRAMUSCULAR | Status: DC | PRN
  Administered 2013-07-08: 2 mg via INTRAVENOUS

## 2013-07-08 MED ORDER — HYDROCODONE-ACETAMINOPHEN 5-325 MG PO TABS: 1.0000 | ORAL_TABLET | Freq: Four times a day (QID) | ORAL | Status: DC | PRN

## 2013-07-08 MED ORDER — ONDANSETRON HCL 4 MG/2ML IJ SOLN
INTRAMUSCULAR | Status: AC
  Filled 2013-07-08: qty 2

## 2013-07-08 MED ORDER — NEOSTIGMINE METHYLSULFATE 1 MG/ML IJ SOLN
INTRAMUSCULAR | Status: AC
  Filled 2013-07-08: qty 1

## 2013-07-08 MED ORDER — MIDAZOLAM HCL 2 MG/2ML IJ SOLN: 0.5000 mg | Freq: Once | INTRAMUSCULAR | Status: DC | PRN

## 2013-07-08 MED ORDER — LIDOCAINE HCL (CARDIAC) 20 MG/ML IV SOLN
INTRAVENOUS | Status: DC | PRN
  Administered 2013-07-08: 50 mg via INTRAVENOUS

## 2013-07-08 MED ORDER — ONDANSETRON HCL 4 MG/2ML IJ SOLN
INTRAMUSCULAR | Status: DC | PRN
  Administered 2013-07-08: 4 mg via INTRAVENOUS

## 2013-07-08 MED ORDER — KETOROLAC TROMETHAMINE 30 MG/ML IJ SOLN
INTRAMUSCULAR | Status: DC | PRN
  Administered 2013-07-08: 30 mg via INTRAVENOUS

## 2013-07-08 MED ORDER — DEXAMETHASONE SODIUM PHOSPHATE 10 MG/ML IJ SOLN
INTRAMUSCULAR | Status: AC
  Filled 2013-07-08: qty 1

## 2013-07-08 MED ORDER — HYDROCODONE-ACETAMINOPHEN 5-325 MG PO TABS
ORAL_TABLET | ORAL | Status: AC
  Administered 2013-07-08: 1
  Filled 2013-07-08: qty 1

## 2013-07-08 MED ORDER — KETOROLAC TROMETHAMINE 30 MG/ML IJ SOLN
INTRAMUSCULAR | Status: AC
  Filled 2013-07-08: qty 1

## 2013-07-08 MED ORDER — PROPOFOL 10 MG/ML IV EMUL
INTRAVENOUS | Status: AC
  Filled 2013-07-08: qty 20

## 2013-07-08 MED ORDER — GLYCOPYRROLATE 0.2 MG/ML IJ SOLN
INTRAMUSCULAR | Status: DC | PRN
  Administered 2013-07-08: 0.4 mg via INTRAVENOUS

## 2013-07-08 MED ORDER — DEXAMETHASONE SODIUM PHOSPHATE 10 MG/ML IJ SOLN
INTRAMUSCULAR | Status: DC | PRN
  Administered 2013-07-08: 10 mg via INTRAVENOUS

## 2013-07-08 MED ORDER — MEPERIDINE HCL 25 MG/ML IJ SOLN: 6.2500 mg | INTRAMUSCULAR | Status: DC | PRN

## 2013-07-08 SURGICAL SUPPLY — 23 items
CATH ROBINSON RED A/P 16FR (CATHETERS) ×2 IMPLANT
CLIP FILSHIE TUBAL LIGA STRL (Clip) ×1 IMPLANT
CLOTH BEACON ORANGE TIMEOUT ST (SAFETY) ×2 IMPLANT
COVER LIGHT HANDLE  1/PK (MISCELLANEOUS) ×2
COVER LIGHT HANDLE 1/PK (MISCELLANEOUS) IMPLANT
DRSG COVADERM PLUS 2X2 (GAUZE/BANDAGES/DRESSINGS) ×1 IMPLANT
GLOVE BIO SURGEON STRL SZ8 (GLOVE) ×2 IMPLANT
GLOVE BIOGEL PI IND STRL 7.0 (GLOVE) IMPLANT
GLOVE BIOGEL PI INDICATOR 7.0 (GLOVE) ×4
GLOVE SURG ORTHO 8.0 STRL STRW (GLOVE) ×2 IMPLANT
GLOVE SURG SS PI 7.0 STRL IVOR (GLOVE) ×4 IMPLANT
GOWN STRL REUS W/ TWL XL LVL3 (GOWN DISPOSABLE) ×1 IMPLANT
GOWN STRL REUS W/TWL LRG LVL3 (GOWN DISPOSABLE) ×2 IMPLANT
GOWN STRL REUS W/TWL XL LVL3 (GOWN DISPOSABLE) ×2
PACK LAPAROSCOPY BASIN (CUSTOM PROCEDURE TRAY) ×2 IMPLANT
PROTECTOR NERVE ULNAR (MISCELLANEOUS) ×1 IMPLANT
SOLUTION ELECTROLUBE (MISCELLANEOUS) ×2 IMPLANT
SUT VICRYL 0 UR6 27IN ABS (SUTURE) ×2 IMPLANT
SUT VICRYL RAPIDE 3 0 (SUTURE) ×2 IMPLANT
TOWEL OR 17X24 6PK STRL BLUE (TOWEL DISPOSABLE) ×4 IMPLANT
TROCAR XCEL DIL TIP R 11M (ENDOMECHANICALS) ×2 IMPLANT
WARMER LAPAROSCOPE (MISCELLANEOUS) ×2 IMPLANT
WATER STERILE IRR 1000ML POUR (IV SOLUTION) ×2 IMPLANT

## 2013-07-08 NOTE — Brief Op Note (Signed)
07/08/2013  8:22 AM  PATIENT:  Latoya Brooks  38 y.o. female  PRE-OPERATIVE DIAGNOSIS:  desires sterilization  POST-OPERATIVE DIAGNOSIS:  desires sterilization  PROCEDURE:  Procedure(s): LAPAROSCOPIC TUBAL LIGATION with Filshie clips (Bilateral)  SURGEON:  Surgeon(s) and Role:    * Turner Danielsavid C Sedale Jenifer, MD - Primary  PHYSICIAN ASSISTANT:   ASSISTANTS: none   ANESTHESIA:   general  EBL:  Total I/O In: 1000 [I.V.:1000] Out: 205 [Urine:200; Blood:5]  BLOOD ADMINISTERED:none  DRAINS: none   LOCAL MEDICATIONS USED:  MARCAINE     SPECIMEN:  No Specimen  DISPOSITION OF SPECIMEN:  N/A  COUNTS:  YES  TOURNIQUET:  * No tourniquets in log *  DICTATION: .Other Dictation: Dictation Number [id number]  PLAN OF CARE: Discharge to home after PACU  PATIENT DISPOSITION:  PACU - hemodynamically stable.   Delay start of Pharmacological VTE agent (>24hrs) due to surgical blood loss or risk of bleeding: not applicable

## 2013-07-08 NOTE — Discharge Instructions (Signed)
DISCHARGE INSTRUCTIONS: Laparoscopy  The following instructions have been prepared to help you care for yourself upon your return home today.  No Ibuprofen containing products (Advil, Aleve, Motrin, etc.) until after 2:30 pm today.  Wound care:  Do not get the incision wet for the first 24 hours. The incision should be kept clean and dry.  The Band-Aids or dressings may be removed the day after surgery.  Should the incision become sore, red, and swollen after the first week, check with your doctor.  Personal hygiene:  Shower the day after your procedure.  Activity and limitations:  Do NOT drive or operate any equipment today.  Do NOT lift anything more than 15 pounds for 2-3 weeks after surgery.  Do NOT rest in bed all day.  Walking is encouraged. Walk each day, starting slowly with 5-minute walks 3 or 4 times a day. Slowly increase the length of your walks.  Walk up and down stairs slowly.  Do NOT do strenuous activities, such as golfing, playing tennis, bowling, running, biking, weight lifting, gardening, mowing, or vacuuming for 2-4 weeks. Ask your doctor when it is okay to start.  Diet: Eat a light meal as desired this evening. You may resume your usual diet tomorrow.  Return to work: This is dependent on the type of work you do. For the most part you can return to a desk job within a week of surgery. If you are more active at work, please discuss this with your doctor.  What to expect after your surgery: You may have a slight burning sensation when you urinate on the first day. You may have a very small amount of blood in the urine. Expect to have a small amount of vaginal discharge/light bleeding for 1-2 weeks. It is not unusual to have abdominal soreness and bruising for up to 2 weeks. You may be tired and need more rest for about 1 week. You may experience shoulder pain for 24-72 hours. Lying flat in bed may relieve it.  Call your doctor for any of the following:   Develop a fever of 100.4 or greater  Inability to urinate 6 hours after discharge from hospital  Severe pain not relieved by pain medications  Persistent of heavy bleeding at incision site  Redness or swelling around incision site after a week  Increasing nausea or vomiting  Patient Signature________________________________________ Nurse Signature_________________________________________

## 2013-07-08 NOTE — Anesthesia Postprocedure Evaluation (Signed)
  Anesthesia Post-op Note  Patient: Latoya Brooks  Procedure(s) Performed: Procedure(s): LAPAROSCOPIC TUBAL LIGATION with Filshie clips (Bilateral) Patient is awake and responsive. Pain and nausea are reasonably well controlled. Vital signs are stable and clinically acceptable. Oxygen saturation is clinically acceptable. There are no apparent anesthetic complications at this time. Patient is ready for discharge.

## 2013-07-08 NOTE — Op Note (Signed)
NAME:  Latoya Brooks, [place]                ACCOUNT NO.:  [id number]  MEDICAL RECORD NO.:  [id number]  LOCATION:  [facility]                          FACILITY:  [facility]  PHYSICIAN:  Dineen KidDavid C. Rana SnareLowe, M.D.    DATE OF BIRTH:  Jan 13, 1976  DATE OF PROCEDURE:  07/08/2013 DATE OF DISCHARGE:                              OPERATIVE REPORT   PREOPERATIVE DIAGNOSIS:  Multiparity, desires sterility.  POSTOPERATIVE DIAGNOSIS:  Multiparity, desires sterility.  PROCEDURE:  Laparoscopy with bilateral tubal ligation with Filshie clips.  SURGEON:  Dineen Kidavid C. Rana SnareLowe, MD  ANESTHESIA:  General endotracheal.  INDICATIONS:  Ms. Latoya Brooks, she is a 38 year old, G5, P2 with no further childbearing, desires definitive surgical intervention for tubal ligation.  Risks and benefits were discussed at length, including evaluated failure rate of 5:1000.  She gives informed consent and wishes to proceed.  FINDINGS AT THE TIME OF SURGERY:  Normal-appearing uterus, tubes, and ovaries, and appropriate placement of the Filshie clips.  DESCRIPTION OF PROCEDURE:  After adequate analgesia, the patient was placed in the dorsal lithotomy position.  She is sterilely prepped and draped.  Bladder sterilely drained.  A Hulka tenaculum was placed on the cervix.  A 1 cm infraumbilical skin incision was made.  A Veress needle was inserted.  The abdomen was insufflated with dullness to percussion. An 11 mm trocar was inserted.  Laparoscope was inserted.  The above findings were noted.  After careful systematic evaluation of the abdomen and pelvis, the right fallopian tube was identified by the fimbriated end.  A portion of fallopian tube was identified, grasped with the Filshie clip, placed across the midportion of fallopian tube with good placement across the entirety of the tube and the patient was taken to document appropriate placement.  The left fallopian tube was identified by the fimbriated end.  Midportion of the fallopian tube was  grasped, Filshie clip placed across the midportion of the tube with good placement noted, and the picture taken to document this as well.  The trocar was removed.  The abdomen was desufflated.  The infraumbilical skin incision was closed with 0 Vicryl interrupted suture in the fascia, 3-0 Vicryl Rapide subcuticular suture.  The incision was injected with 0.25% Marcaine, total of 10 mL was used.  The tenaculum was removed from the cervix, noted to be hemostatic.  The patient was transferred recovery room in stable condition.  Sponge and instrument count was normal x3.  ESTIMATED BLOOD LOSS:  Minimal.  DISPOSITION:  The patient will be discharged home, will follow up in the office in 2-3 weeks.  Sent her with routine instruction sheet for laparoscopy, told to return for increased pain, fever, or bleeding. Also sent home with a prescription for Vicodin #20.     Dineen Kidavid C. Rana SnareLowe, M.D.     DCL/MEDQ  D:  07/08/2013  T:  07/08/2013  Job:  [id number]

## 2013-07-08 NOTE — Transfer of Care (Signed)
Immediate Anesthesia Transfer of Care Note  Patient: Latoya Brooks  Procedure(s) Performed: Procedure(s): LAPAROSCOPIC TUBAL LIGATION with Filshie clips (Bilateral)  Patient Location: PACU  Anesthesia Type:General  Level of Consciousness: awake  Airway & Oxygen Therapy: Patient Spontanous Breathing  Post-op Assessment: Report given to PACU RN  Post vital signs: stable  Filed Vitals:   07/08/13 0623  BP: 121/63  Pulse: 85  Temp: 37.2 C  Resp: 16    Complications: No apparent anesthesia complications

## 2013-07-08 NOTE — Anesthesia Preprocedure Evaluation (Signed)
Anesthesia Evaluation  Patient identified by MRN, date of birth, ID band Patient awake    Reviewed: Allergy & Precautions, H&P , Patient's Chart, lab work & pertinent test results, reviewed documented beta blocker date and time   History of Anesthesia Complications Negative for: history of anesthetic complications  Airway Mallampati: III TM Distance: >3 FB Neck ROM: full    Dental   Pulmonary Current Smoker,  breath sounds clear to auscultation        Cardiovascular Exercise Tolerance: Good Rhythm:regular Rate:Normal     Neuro/Psych    GI/Hepatic GERD-  Controlled,  Endo/Other    Renal/GU      Musculoskeletal   Abdominal   Peds  Hematology   Anesthesia Other Findings   Reproductive/Obstetrics                           Anesthesia Physical Anesthesia Plan  ASA: II  Anesthesia Plan: General ETT   Post-op Pain Management:    Induction:   Airway Management Planned:   Additional Equipment:   Intra-op Plan:   Post-operative Plan:   Informed Consent: I have reviewed the patients History and Physical, chart, labs and discussed the procedure including the risks, benefits and alternatives for the proposed anesthesia with the patient or authorized representative who has indicated his/her understanding and acceptance.   Dental Advisory Given  Plan Discussed with: CRNA and Surgeon  Anesthesia Plan Comments:         Anesthesia Quick Evaluation

## 2013-07-11 ENCOUNTER — Encounter (HOSPITAL_COMMUNITY): Payer: Self-pay | Admitting: Obstetrics and Gynecology

## 2013-07-12 NOTE — Addendum Note (Signed)
Addendum created 07/12/13 1117 by Cristela BlueKyle Kalkidan Caudell, MD   Modules edited: Anesthesia Attestations

## 2014-03-13 ENCOUNTER — Encounter (HOSPITAL_COMMUNITY): Payer: Self-pay | Admitting: Obstetrics and Gynecology

## 2014-05-12 ENCOUNTER — Encounter (HOSPITAL_COMMUNITY): Payer: Self-pay | Admitting: Emergency Medicine

## 2014-05-12 ENCOUNTER — Emergency Department (HOSPITAL_COMMUNITY)
Admission: EM | Admit: 2014-05-12 | Discharge: 2014-05-12 | Disposition: A | Discharge status: Home / Self Care | Payer: 59 | Attending: Emergency Medicine | Admitting: Emergency Medicine

## 2014-05-12 ENCOUNTER — Emergency Department (HOSPITAL_COMMUNITY): Payer: 59

## 2014-05-12 DIAGNOSIS — Z3202 Encounter for pregnancy test, result negative: Secondary | ICD-10-CM | POA: Insufficient documentation

## 2014-05-12 DIAGNOSIS — Z720 Tobacco use: Secondary | ICD-10-CM | POA: Insufficient documentation

## 2014-05-12 DIAGNOSIS — R61 Generalized hyperhidrosis: Secondary | ICD-10-CM | POA: Insufficient documentation

## 2014-05-12 DIAGNOSIS — Z792 Long term (current) use of antibiotics: Secondary | ICD-10-CM | POA: Insufficient documentation

## 2014-05-12 DIAGNOSIS — R110 Nausea: Secondary | ICD-10-CM | POA: Insufficient documentation

## 2014-05-12 DIAGNOSIS — K219 Gastro-esophageal reflux disease without esophagitis: Secondary | ICD-10-CM | POA: Diagnosis not present

## 2014-05-12 DIAGNOSIS — R109 Unspecified abdominal pain: Secondary | ICD-10-CM

## 2014-05-12 DIAGNOSIS — Z9851 Tubal ligation status: Secondary | ICD-10-CM | POA: Insufficient documentation

## 2014-05-12 DIAGNOSIS — Z9049 Acquired absence of other specified parts of digestive tract: Secondary | ICD-10-CM | POA: Insufficient documentation

## 2014-05-12 DIAGNOSIS — R1013 Epigastric pain: Secondary | ICD-10-CM | POA: Diagnosis not present

## 2014-05-12 LAB — URINALYSIS, ROUTINE W REFLEX MICROSCOPIC
Bilirubin Urine: NEGATIVE
GLUCOSE, UA: NEGATIVE mg/dL
KETONES UR: NEGATIVE mg/dL
LEUKOCYTES UA: NEGATIVE
Nitrite: NEGATIVE
Protein, ur: NEGATIVE mg/dL
SPECIFIC GRAVITY, URINE: 1.021 (ref 1.005–1.030)
Urobilinogen, UA: 0.2 mg/dL (ref 0.0–1.0)
pH: 8.5 — ABNORMAL HIGH (ref 5.0–8.0)

## 2014-05-12 LAB — I-STAT CHEM 8, ED
BUN: 9 mg/dL (ref 6–23)
Calcium, Ion: 1.17 mmol/L (ref 1.12–1.23)
Chloride: 106 mEq/L (ref 96–112)
Creatinine, Ser: 0.6 mg/dL (ref 0.50–1.10)
GLUCOSE: 120 mg/dL — AB (ref 70–99)
HEMATOCRIT: 44 % (ref 36.0–46.0)
HEMOGLOBIN: 15 g/dL (ref 12.0–15.0)
POTASSIUM: 3.7 mmol/L (ref 3.5–5.1)
Sodium: 142 mmol/L (ref 135–145)
TCO2: 19 mmol/L (ref 0–100)

## 2014-05-12 LAB — CBC WITH DIFFERENTIAL/PLATELET
BASOS PCT: 0 % (ref 0–1)
Basophils Absolute: 0 10*3/uL (ref 0.0–0.1)
Eosinophils Absolute: 0.1 10*3/uL (ref 0.0–0.7)
Eosinophils Relative: 2 % (ref 0–5)
HEMATOCRIT: 40.9 % (ref 36.0–46.0)
HEMOGLOBIN: 13.8 g/dL (ref 12.0–15.0)
LYMPHS PCT: 52 % — AB (ref 12–46)
Lymphs Abs: 3.2 10*3/uL (ref 0.7–4.0)
MCH: 27.9 pg (ref 26.0–34.0)
MCHC: 33.7 g/dL (ref 30.0–36.0)
MCV: 82.8 fL (ref 78.0–100.0)
MONO ABS: 0.5 10*3/uL (ref 0.1–1.0)
Monocytes Relative: 9 % (ref 3–12)
NEUTROS ABS: 2.2 10*3/uL (ref 1.7–7.7)
NEUTROS PCT: 37 % — AB (ref 43–77)
Platelets: 350 10*3/uL (ref 150–400)
RBC: 4.94 MIL/uL (ref 3.87–5.11)
RDW: 14.1 % (ref 11.5–15.5)
WBC: 6 10*3/uL (ref 4.0–10.5)

## 2014-05-12 LAB — URINE MICROSCOPIC-ADD ON

## 2014-05-12 LAB — COMPREHENSIVE METABOLIC PANEL
ALBUMIN: 3.5 g/dL (ref 3.5–5.2)
ALT: 31 U/L (ref 0–35)
AST: 57 U/L — AB (ref 0–37)
Alkaline Phosphatase: 70 U/L (ref 39–117)
Anion gap: 10 (ref 5–15)
BUN: 9 mg/dL (ref 6–23)
CALCIUM: 9.3 mg/dL (ref 8.4–10.5)
CO2: 22 mmol/L (ref 19–32)
CREATININE: 0.7 mg/dL (ref 0.50–1.10)
Chloride: 108 mEq/L (ref 96–112)
GFR calc Af Amer: >90 mL/min (ref >90)
GFR calc non Af Amer: >90 mL/min (ref >90)
Glucose, Bld: 125 mg/dL — ABNORMAL HIGH (ref 70–99)
Potassium: 3.6 mmol/L (ref 3.5–5.1)
Sodium: 140 mmol/L (ref 135–145)
Total Bilirubin: 0.8 mg/dL (ref 0.3–1.2)
Total Protein: 6.9 g/dL (ref 6.0–8.3)

## 2014-05-12 LAB — I-STAT CREATININE, ED: CREATININE: 0.7 mg/dL (ref 0.50–1.10)

## 2014-05-12 LAB — PREGNANCY, URINE: Preg Test, Ur: NEGATIVE

## 2014-05-12 LAB — LIPASE, BLOOD: Lipase: 30 U/L (ref 11–59)

## 2014-05-12 LAB — I-STAT BETA HCG BLOOD, ED (MC, WL, AP ONLY): I-stat hCG, quantitative: <5 m[IU]/mL (ref <5)

## 2014-05-12 MED ORDER — GI COCKTAIL ~~LOC~~
30.0000 mL | Freq: Once | ORAL | Status: AC
  Administered 2014-05-12: 30 mL via ORAL
  Filled 2014-05-12: qty 30

## 2014-05-12 MED ORDER — IOHEXOL 300 MG/ML  SOLN
25.0000 mL | Freq: Once | INTRAMUSCULAR | Status: AC | PRN
  Administered 2014-05-12: 25 mL via ORAL

## 2014-05-12 MED ORDER — SUCRALFATE 1 GM/10ML PO SUSP: 1.0000 g | Freq: Three times a day (TID) | ORAL | Status: DC

## 2014-05-12 MED ORDER — ONDANSETRON HCL 4 MG/2ML IJ SOLN
4.0000 mg | Freq: Once | INTRAMUSCULAR | Status: AC
  Administered 2014-05-12: 4 mg via INTRAVENOUS
  Filled 2014-05-12: qty 2

## 2014-05-12 MED ORDER — HYDROMORPHONE HCL 1 MG/ML IJ SOLN
1.0000 mg | Freq: Once | INTRAMUSCULAR | Status: AC
  Administered 2014-05-12: 1 mg via INTRAVENOUS
  Filled 2014-05-12: qty 1

## 2014-05-12 MED ORDER — GI COCKTAIL ~~LOC~~: 30.0000 mL | Freq: Once | ORAL | Status: DC

## 2014-05-12 MED ORDER — PANTOPRAZOLE SODIUM 40 MG PO TBEC: 40.0000 mg | DELAYED_RELEASE_TABLET | Freq: Every day | ORAL | Status: DC

## 2014-05-12 NOTE — ED Notes (Signed)
Pt. woke up this morning with mid/upper abdominal pain , denies nausea or vomitting , no diarrhea or fever .

## 2014-05-12 NOTE — Discharge Instructions (Signed)
As discussed, your evaluation today has been largely reassuring.  Your pain is likely due to gastritis.  But, it is important that you monitor your condition carefully, and do not hesitate to return to the ED if you develop new, or concerning changes in your condition.  Otherwise, please follow-up with your physician for appropriate ongoing care.    Abdominal Pain Many things can cause abdominal pain. Usually, abdominal pain is not caused by a disease and will improve without treatment. It can often be observed and treated at home. Your health care provider will do a physical exam and possibly order blood tests and X-rays to help determine the seriousness of your pain. However, in many cases, more time must pass before a clear cause of the pain can be found. Before that point, your health care provider may not know if you need more testing or further treatment. HOME CARE INSTRUCTIONS  Monitor your abdominal pain for any changes. The following actions may help to alleviate any discomfort you are experiencing:  Only take over-the-counter or prescription medicines as directed by your health care provider.  Do not take laxatives unless directed to do so by your health care provider.  Try a clear liquid diet (broth, tea, or water) as directed by your health care provider. Slowly move to a bland diet as tolerated. SEEK MEDICAL CARE IF:  You have unexplained abdominal pain.  You have abdominal pain associated with nausea or diarrhea.  You have pain when you urinate or have a bowel movement.  You experience abdominal pain that wakes you in the night.  You have abdominal pain that is worsened or improved by eating food.  You have abdominal pain that is worsened with eating fatty foods.  You have a fever. SEEK IMMEDIATE MEDICAL CARE IF:   Your pain does not go away within 2 hours.  You keep throwing up (vomiting).  Your pain is felt only in portions of the abdomen, such as the right side  or the left lower portion of the abdomen.  You pass bloody or black tarry stools. MAKE SURE YOU:  Understand these instructions.   Will watch your condition.   Will get help right away if you are not doing well or get worse.  Document Released: 02/05/2005 Document Revised: 05/03/2013 Document Reviewed: 01/05/2013 [facility] Patient Information 2015 [place], [place]. This information is not intended to replace advice given to you by your health care provider. Make sure you discuss any questions you have with your health care provider.

## 2014-05-12 NOTE — ED Provider Notes (Signed)
Discussed UA results with patient.  She is on her menses.  Linwood Dibbles, MD 05/12/14 [phone]

## 2014-05-12 NOTE — ED Notes (Signed)
Phlebotomy at the bedside  

## 2014-05-12 NOTE — ED Provider Notes (Signed)
CSN: [id number]     Arrival date & time 05/12/14  0422 History   First MD Initiated Contact with Patient 05/12/14 0455     Chief Complaint  Patient presents with  . Abdominal Pain     (Consider location/radiation/quality/duration/timing/severity/associated sxs/prior Treatment) HPI Patient presents after the acute onset of abdominal pain.  Pain began approximately one hour ago.  Pain is epigastric, nonradiating, sore, severe, with associated nausea, no vomiting. No diarrhea. No back pain. There is associated dyspnea, secondary to pain in the epigastrium. No lateral pain. No relief with anything. No clear exacerbating factors either. Patient has a history of prior cholecystectomy.  Past Medical History  Diagnosis Date  . Preterm labor   . SVD (spontaneous vaginal delivery)     x 3  . GERD (gastroesophageal reflux disease)     occasionally - med prn   Past Surgical History  Procedure Laterality Date  . Cholecystectomy    . Wisdom tooth extraction      general anesthesia  . Laparoscopic tubal ligation Bilateral 07/08/2013    Procedure: LAPAROSCOPIC TUBAL LIGATION with Filshie clips;  Surgeon: Turner Daniels, MD;  Location: [facility];  Service: Gynecology;  Laterality: Bilateral;   No family history on file. History  Substance Use Topics  . Smoking status: Current Some Day Smoker -- 0.25 packs/day for 20 years    Types: Cigarettes  . Smokeless tobacco: Never Used  . Alcohol Use: Yes     Comment: occasional   OB History    Gravida Para Term Preterm AB TAB SAB Ectopic Multiple Living   0 0 0 3     Review of Systems  Constitutional:       Per HPI, otherwise negative  HENT:       Per HPI, otherwise negative  Respiratory:       Per HPI, otherwise negative  Cardiovascular:       Per HPI, otherwise negative  Gastrointestinal: Positive for nausea and abdominal pain. Negative for vomiting.  Endocrine:       Negative aside from HPI  Genitourinary:       Neg aside  from HPI   Musculoskeletal:       Per HPI, otherwise negative  Skin: Negative.   Neurological: Negative for syncope.      Allergies  Review of patient's allergies indicates no known allergies.  Home Medications   Prior to Admission medications   Medication Sig Start Date End Date Taking? Authorizing Provider  calcium carbonate (TUMS - DOSED IN MG ELEMENTAL CALCIUM) 500 MG chewable tablet Chew 2 tablets by mouth as needed for indigestion or heartburn.    Historical Provider, MD  HYDROcodone-acetaminophen (NORCO/VICODIN) 5-325 MG per tablet Take 1-2 tablets by mouth every 6 (six) hours as needed for moderate pain. 07/08/13   Turner Daniels, MD  metroNIDAZOLE (FLAGYL) 500 MG tablet Take 1 tablet (500 mg total) by mouth 2 (two) times daily. 06/17/13   Lattie Haw, MD  pantoprazole (PROTONIX) 40 MG tablet Take 40 mg by mouth daily as needed.    Historical Provider, MD   BP 148/90 mmHg  Pulse 81  Temp(Src) 97.8 F (36.6 C) (Oral)  Resp 24  Ht  (1.676 m)  Wt 240 lb (108.863 kg)  BMI 38.76 kg/m2  SpO2 100%  LMP 05/10/2014 Physical Exam  Constitutional: She is oriented to person, place, and time. She appears well-developed and well-nourished. She appears distressed.  HENT:  Head: Normocephalic  and atraumatic.  Eyes: Conjunctivae and EOM are normal.  Cardiovascular: Normal rate and regular rhythm.   Pulmonary/Chest: Effort normal and breath sounds normal. No stridor. No respiratory distress.  Abdominal: She exhibits no distension.  Patient has negligible tenderness to palpation, though she states the pain is deep in the epigastrium.  Musculoskeletal: She exhibits no edema.  Neurological: She is alert and oriented to person, place, and time. No cranial nerve deficit.  Skin: Skin is warm. She is diaphoretic.  Psychiatric: She has a normal mood and affect.  Nursing note and vitals reviewed.   ED Course  Procedures (including critical care time) Labs Review Labs Reviewed  CBC  WITH DIFFERENTIAL  COMPREHENSIVE METABOLIC PANEL  LIPASE, BLOOD  PREGNANCY, URINE  URINALYSIS, ROUTINE W REFLEX MICROSCOPIC  I-STAT CHEM 8, ED  I-STAT BETA HCG BLOOD, ED ([facility], [facility], [facility] ONLY)  I-STAT CREATININE, ED    Imaging Review No results found.   EKG Interpretation   Date/Time:  Friday May 12 2014 05:01:00 EST Ventricular Rate:  73 PR Interval:  142 QRS Duration: 101 QT Interval:  389 QTC Calculation: 429 R Axis:   60 Text Interpretation:  Sinus rhythm Supraventricular bigeminy Abnormal  inferior Q waves Sinus rhythm No significant change since last tracing q  waves anteriorly Abnormal ekg Confirmed by Gerhard Munch  MD ([id number]) on  05/12/2014 5:05:05 AM     8:00 AM Patient smiling, states that she is "normal" MDM   Final diagnoses:  Abdominal pain    Patient presents with excruciating abdominal pain. Given the severe nature of her complaints, patient had emergent CT scan in addition to labs, fluids, analgesics, antiemetics.  Patient's findings were largely reassuring, and her pain resolved entirely. Patient discharged in stable condition to follow-up with GI. She was started on new antacid therapy.    Gerhard Munch, MD 05/12/14 [phone]

## 2014-05-12 NOTE — ED Notes (Signed)
Dr. Lockwood is at the bedside.  

## 2014-05-12 NOTE — ED Notes (Signed)
Patient sitting up erect in bed from pain.

## 2014-05-12 NOTE — ED Notes (Signed)
Explained to the patient that urine sample is needed.  

## 2014-05-12 NOTE — ED Notes (Signed)
Called CT to report patient has finished drinking contrast. 

## 2015-02-22 ENCOUNTER — Ambulatory Visit (INDEPENDENT_AMBULATORY_CARE_PROVIDER_SITE_OTHER): Payer: 59 | Admitting: Family Medicine

## 2015-02-22 VITALS — BP 113/72 | HR 72 | Temp 98.3°F | Resp 18 | Ht 67.0 in | Wt 236.4 lb

## 2015-02-22 DIAGNOSIS — J029 Acute pharyngitis, unspecified: Secondary | ICD-10-CM

## 2015-02-22 LAB — POCT RAPID STREP A (OFFICE): Rapid Strep A Screen: NEGATIVE

## 2015-02-22 MED ORDER — PENICILLIN G BENZATHINE 1200000 UNIT/2ML IM SUSP
1.2000 10*6.[IU] | Freq: Once | INTRAMUSCULAR | Status: AC
  Administered 2015-02-22: 1.2 10*6.[IU] via INTRAMUSCULAR

## 2015-02-22 NOTE — Progress Notes (Addendum)
[facility] [address], [place] [address] [phone]  Date:  02/22/2015   Name:  Latoya Brooks   DOB:  Jun 18, 1975   MRN:  [id number]  PCP:  No PCP Per Patient    Chief Complaint: Sore Throat   History of Present Illness:  Latoya Brooks is a 39 y.o. very pleasant female patient who presents with the following:  She noted ST yesterday am- it is still there today She feels tired and "blah", she feels better for a while after she takes ibuprofen but then sx return Her neck hurts  She has not noted a fever No cough She is generally in good health    There are no active problems to display for this patient.   Past Medical History  Diagnosis Date  . Preterm labor   . SVD (spontaneous vaginal delivery)     x 3  . GERD (gastroesophageal reflux disease)     occasionally - med prn    Past Surgical History  Procedure Laterality Date  . Cholecystectomy    . Wisdom tooth extraction      general anesthesia  . Laparoscopic tubal ligation Bilateral 07/08/2013    Procedure: LAPAROSCOPIC TUBAL LIGATION with Filshie clips;  Surgeon: Turner Danielsavid C Lowe, MD;  Location: [facility];  Service: Gynecology;  Laterality: Bilateral;    Social History  Substance Use Topics  . Smoking status: Current Some Day Smoker -- 0.25 packs/day for 20 years    Types: Cigarettes  . Smokeless tobacco: Never Used  . Alcohol Use: 0.0 oz/week    0 Standard drinks or equivalent per week     Comment: occasional    No family history on file.  No Known Allergies  Medication list has been reviewed and updated.  Current Outpatient Prescriptions on File Prior to Visit  Medication Sig Dispense Refill  . calcium carbonate (TUMS - DOSED IN MG ELEMENTAL CALCIUM) 500 MG chewable tablet Chew 2 tablets by mouth as needed for indigestion or heartburn.    Marland Kitchen. HYDROcodone-acetaminophen (NORCO/VICODIN) 5-325 MG per tablet Take 1-2 tablets by mouth every 6 (six) hours as needed for moderate pain.  (Patient not taking: Reported on 05/12/2014) 20 tablet 0  . metroNIDAZOLE (FLAGYL) 500 MG tablet Take 1 tablet (500 mg total) by mouth 2 (two) times daily. (Patient not taking: Reported on 05/12/2014) 14 tablet 0  . sucralfate (CARAFATE) 1 GM/10ML suspension Take 10 mLs (1 g total) by mouth 4 (four) times daily -  with meals and at bedtime. (Patient not taking: Reported on 02/22/2015) 420 mL 0   No current facility-administered medications on file prior to visit.    Review of Systems:  As per HPI- otherwise negative.   Physical Examination: Filed Vitals:   02/22/15 1949  BP: 113/72  Pulse: 72  Temp: 98.3 F (36.8 C)  Resp: 18   Filed Vitals:   02/22/15 1949  Height: 5\' 7"  (1.702 m)  Weight: 236 lb 6 oz (107.219 kg)   Body mass index is 37.01 kg/(m^2). Ideal Body Weight: Weight in (lb) to have BMI = 25: 159.3  GEN: WDWN, NAD, Non-toxic, A & O x 3, obese, looks well HEENT: Atraumatic, Normocephalic. Neck supple. No masses, No LAD.  Bilateral TM wnl, oropharynx normal.  PEERL,EOMI.   Ears and Nose: No external deformity. CV: RRR, No M/G/R. No JVD. No thrill. No extra heart sounds. PULM: CTA B, no wheezes, crackles, rhonchi. No retractions. No resp. distress. No accessory muscle use. ABD:  S, NT, ND, +BS. No rebound. No HSM. EXTR: No c/c/e NEURO Normal gait.  PSYCH: Normally interactive. Conversant. Not depressed or anxious appearing.  Calm demeanor.   Results for orders placed or performed in visit on 02/22/15  POCT rapid strep A  Result Value Ref Range   Rapid Strep A Screen Negative Negative    Assessment and Plan: Pharyngitis - Plan: POCT rapid strep A, Culture, Group A Strep, penicillin g benzathine (BICILLIN LA) 1200000 UNIT/2ML injection 1.2 Million Units  Suspect strep She would like to have a shot for treatment although she knows she may not truly have GAS.   Will treat with bicillin LA, supportive treatment as needed   Meds ordered this encounter  Medications  .  pantoprazole (PROTONIX) 40 MG tablet    Sig: Take 40 mg by mouth daily.  . penicillin g benzathine (BICILLIN LA) 1200000 UNIT/2ML injection 1.2 Million Units    Sig:     Order Specific Question:  Antibiotic Indication:    Answer:  Pharyngitis   Signed Abbe Amsterdam, MD 10/17 Received her culture- it is negative.  She ended up in the ER due to pain in her throat a couple of days after OV here. However she is now better  Results for orders placed or performed in visit on 02/22/15  Culture, Group A Strep  Result Value Ref Range   Organism ID, Bacteria Normal Upper Respiratory Flora    Organism ID, Bacteria No Beta Hemolytic Streptococci Isolated   POCT rapid strep A  Result Value Ref Range   Rapid Strep A Screen Negative Negative

## 2015-02-22 NOTE — Patient Instructions (Signed)
Your rapid strep is negative, but I am still suspicious that you have strep throat.  We treated you with a shot of penicillin today- no other treatment is needed I will be in touch with your throat culture asap Let us know if you do not feel better in the next 1-2 days

## 2015-02-24 ENCOUNTER — Emergency Department (HOSPITAL_COMMUNITY)
Admission: EM | Admit: 2015-02-24 | Discharge: 2015-02-24 | Disposition: A | Discharge status: Home / Self Care | Payer: 59 | Attending: Emergency Medicine | Admitting: Emergency Medicine

## 2015-02-24 ENCOUNTER — Emergency Department (HOSPITAL_COMMUNITY): Payer: 59

## 2015-02-24 ENCOUNTER — Encounter (HOSPITAL_COMMUNITY): Payer: Self-pay | Admitting: Emergency Medicine

## 2015-02-24 DIAGNOSIS — J029 Acute pharyngitis, unspecified: Secondary | ICD-10-CM | POA: Diagnosis present

## 2015-02-24 DIAGNOSIS — J36 Peritonsillar abscess: Secondary | ICD-10-CM | POA: Diagnosis not present

## 2015-02-24 DIAGNOSIS — Z720 Tobacco use: Secondary | ICD-10-CM | POA: Insufficient documentation

## 2015-02-24 DIAGNOSIS — Z79899 Other long term (current) drug therapy: Secondary | ICD-10-CM | POA: Diagnosis not present

## 2015-02-24 DIAGNOSIS — K219 Gastro-esophageal reflux disease without esophagitis: Secondary | ICD-10-CM | POA: Diagnosis not present

## 2015-02-24 DIAGNOSIS — Z8751 Personal history of pre-term labor: Secondary | ICD-10-CM | POA: Insufficient documentation

## 2015-02-24 LAB — CBC WITH DIFFERENTIAL/PLATELET
Basophils Absolute: 0 10*3/uL (ref 0.0–0.1)
Basophils Relative: 0 %
Eosinophils Absolute: 0.1 10*3/uL (ref 0.0–0.7)
Eosinophils Relative: 2 %
HCT: 36.2 % (ref 36.0–46.0)
Hemoglobin: 11.8 g/dL — ABNORMAL LOW (ref 12.0–15.0)
LYMPHS PCT: 29 %
Lymphs Abs: 2.1 10*3/uL (ref 0.7–4.0)
MCH: 26.6 pg (ref 26.0–34.0)
MCHC: 32.6 g/dL (ref 30.0–36.0)
MCV: 81.7 fL (ref 78.0–100.0)
Monocytes Absolute: 0.7 10*3/uL (ref 0.1–1.0)
Monocytes Relative: 9 %
Neutro Abs: 4.3 10*3/uL (ref 1.7–7.7)
Neutrophils Relative %: 60 %
Platelets: 264 10*3/uL (ref 150–400)
RBC: 4.43 MIL/uL (ref 3.87–5.11)
RDW: 14.1 % (ref 11.5–15.5)
WBC: 7.2 10*3/uL (ref 4.0–10.5)

## 2015-02-24 LAB — BASIC METABOLIC PANEL
ANION GAP: 8 (ref 5–15)
BUN: 9 mg/dL (ref 6–20)
CALCIUM: 8.4 mg/dL — AB (ref 8.9–10.3)
CO2: 25 mmol/L (ref 22–32)
Chloride: 104 mmol/L (ref 101–111)
Creatinine, Ser: 0.63 mg/dL (ref 0.44–1.00)
GFR calc Af Amer: >60 mL/min (ref >60)
GFR calc non Af Amer: >60 mL/min (ref >60)
GLUCOSE: 99 mg/dL (ref 65–99)
Potassium: 3.5 mmol/L (ref 3.5–5.1)
Sodium: 137 mmol/L (ref 135–145)

## 2015-02-24 LAB — CULTURE, GROUP A STREP: ORGANISM ID, BACTERIA: NORMAL

## 2015-02-24 MED ORDER — DEXAMETHASONE SODIUM PHOSPHATE 10 MG/ML IJ SOLN
10.0000 mg | Freq: Once | INTRAMUSCULAR | Status: AC
  Administered 2015-02-24: 10 mg via INTRAVENOUS
  Filled 2015-02-24: qty 1

## 2015-02-24 MED ORDER — SODIUM CHLORIDE 0.9 % IV BOLUS (SEPSIS)
1000.0000 mL | Freq: Once | INTRAVENOUS | Status: AC
  Administered 2015-02-24: 1000 mL via INTRAVENOUS

## 2015-02-24 MED ORDER — IBUPROFEN 800 MG PO TABS: 800.0000 mg | ORAL_TABLET | Freq: Three times a day (TID) | ORAL | Status: DC

## 2015-02-24 MED ORDER — KETOROLAC TROMETHAMINE 30 MG/ML IJ SOLN
30.0000 mg | Freq: Once | INTRAMUSCULAR | Status: AC
  Administered 2015-02-24: 30 mg via INTRAVENOUS
  Filled 2015-02-24: qty 1

## 2015-02-24 MED ORDER — CLINDAMYCIN HCL 150 MG PO CAPS: 300.0000 mg | ORAL_CAPSULE | Freq: Three times a day (TID) | ORAL | Status: DC

## 2015-02-24 MED ORDER — HYDROCODONE-ACETAMINOPHEN 5-325 MG PO TABS: 1.0000 | ORAL_TABLET | ORAL | Status: DC | PRN

## 2015-02-24 MED ORDER — IOHEXOL 300 MG/ML  SOLN
100.0000 mL | Freq: Once | INTRAMUSCULAR | Status: AC | PRN
  Administered 2015-02-24: 100 mL via INTRAVENOUS

## 2015-02-24 NOTE — ED Notes (Signed)
Patient here with complaint of throat pain. States right worse than left. [facility] Was given injection of abx, but not started on PO abx. States rapid strep was negative at that time. Denies fever.

## 2015-02-24 NOTE — ED Provider Notes (Signed)
CSN: [id number]     Arrival date & time 02/24/15  1953 History   First MD Initiated Contact with Patient 02/24/15 2008     No chief complaint on file.    (Consider location/radiation/quality/duration/timing/severity/associated sxs/prior Treatment) HPI Comments: Patient is a 39 year old female who presents with a 4 day history of sore throat. Patient reports gradual onset and progressively worsening sharp, severe throat pain. The pain is constant and made worse with swallowing. The pain is localized to the patient's throat and more painful on the right. Ibuprofen alleviates some of the pain. Swallowing makes the pain worse. Patient reports associated subjective fever, cervical adenopathy, and chills Patient denies headache, visual changes, sinus congestion, difficulty breathing, chest pain, SOB, abdominal pain, NVD. Patient was seen at [facility] Care 2 days ago and treated for strep throat with IM penicillin but does not feel better.      Past Medical History  Diagnosis Date  . Preterm labor   . SVD (spontaneous vaginal delivery)     x 3  . GERD (gastroesophageal reflux disease)     occasionally - med prn   Past Surgical History  Procedure Laterality Date  . Cholecystectomy    . Wisdom tooth extraction      general anesthesia  . Laparoscopic tubal ligation Bilateral 07/08/2013    Procedure: LAPAROSCOPIC TUBAL LIGATION with Filshie clips;  Surgeon: Turner Danielsavid C Lowe, MD;  Location: [facility];  Service: Gynecology;  Laterality: Bilateral;   History reviewed. No pertinent family history. Social History  Substance Use Topics  . Smoking status: Current Some Day Smoker -- 0.25 packs/day for 20 years    Types: Cigarettes  . Smokeless tobacco: Never Used  . Alcohol Use: 0.0 oz/week    0 Standard drinks or equivalent per week     Comment: occasional   OB History    Gravida Para Term Preterm AB TAB SAB Ectopic Multiple Living   5 3 2 1 2 2  0 0 0 3     Review of Systems  Constitutional:  Positive for chills.  HENT: Positive for sore throat.   All other systems reviewed and are negative.     Allergies  Review of patient's allergies indicates no known allergies.  Home Medications   Prior to Admission medications   Medication Sig Start Date End Date Taking? Authorizing Provider  calcium carbonate (TUMS - DOSED IN MG ELEMENTAL CALCIUM) 500 MG chewable tablet Chew 2 tablets by mouth as needed for indigestion or heartburn.    Historical Provider, MD  pantoprazole (PROTONIX) 40 MG tablet Take 40 mg by mouth daily.    Historical Provider, MD   BP 157/68 mmHg  Pulse 67  Temp(Src) 98.8 F (37.1 C) (Oral)  Resp 20  Ht 5\' 6"  (1.676 m)  Wt 236 lb (107.049 kg)  BMI 38.11 kg/m2  SpO2 98%  LMP 02/24/2015 (Exact Date) Physical Exam  Constitutional: She is oriented to person, place, and time. She appears well-developed and well-nourished. No distress.  HENT:  Head: Normocephalic and atraumatic.  Mouth/Throat: No oropharyngeal exudate.  Bilateral tonsillar edema. No erythema or exudate.   Eyes: Conjunctivae and EOM are normal.  Neck: Normal range of motion.  Right cervical adenopathy  Cardiovascular: Normal rate and regular rhythm.  Exam reveals no gallop and no friction rub.   No murmur heard. Pulmonary/Chest: Effort normal and breath sounds normal. She has no wheezes. She has no rales. She exhibits no tenderness.  Abdominal: Soft. She exhibits no distension. There is  no tenderness. There is no rebound.  Musculoskeletal: Normal range of motion.  Lymphadenopathy:    She has cervical adenopathy.  Neurological: She is alert and oriented to person, place, and time. Coordination normal.  Speech is goal-oriented. Moves limbs without ataxia.   Skin: Skin is warm and dry.  Psychiatric: She has a normal mood and affect. Her behavior is normal.  Nursing note and vitals reviewed.   ED Course  Procedures (including critical care time) Labs Review Labs Reviewed  CBC WITH  DIFFERENTIAL/PLATELET - Abnormal; Notable for the following:    Hemoglobin 11.8 (*)    All other components within normal limits  BASIC METABOLIC PANEL - Abnormal; Notable for the following:    Calcium 8.4 (*)    All other components within normal limits    Imaging Review Ct Soft Tissue Neck W Contrast  02/24/2015  CLINICAL DATA:  Initial evaluation for acute pain and swelling behind left ear. Difficulty swelling. EXAM: CT NECK WITH CONTRAST TECHNIQUE: Multidetector CT imaging of the neck was performed using the standard protocol following the bolus administration of intravenous contrast. CONTRAST:  OMNIPAQUE IOHEXOL 300 MG/ML  SOLN COMPARISON:  None available. FINDINGS: Visualized portions of the brain demonstrate no acute abnormality. Globes and orbits within normal limits. Visualized paranasal sinuses are clear. No mastoid effusion. Middle ear cavities are clear. Salivary glands including the parotid glands and submandibular glands are normal. Oral cavity within normal limits. Palatine tonsils are somewhat enlarged and mildly hyper enhancing bilaterally, abutting at the midline. The right tonsil is larger than the left. There is a hypodense rim enhancing collection measuring 1.5 x 0.9 x 1.4 cm within the right tonsil, worrisome for tonsillar abscess. Mild hazy fat stranding within the adjacent parapharyngeal fat. No abscess about the left tonsil. The left parapharyngeal fat is preserved. Mild edema within the adjacent right oropharyngeal mucosa. Epiglottis itself is normal. Vallecula is clear. No retropharyngeal collection or abscess. Remainder of the hypopharynx and supraglottic larynx are normal. True vocal cords symmetric and within normal limits. Subglottic airway is clear. Thyroid gland normal. No pathologically enlarged lymph nodes identified within the neck. Right level IIa lymph nodes measure up to 9 mm in short axis. Visualized superior mediastinum unremarkable. Partially visualized lung  apices demonstrate no acute process. Normal intravascular enhancement seen within the neck. No acute osseous abnormality. No worrisome lytic or blastic osseous lesions. IMPRESSION: Findings concerning for acute tonsillitis/pharyngitis involving the right oropharynx. There is a superimposed 1.5 x 0.9 x 1.4 cm abscess within the right palatine tonsil. Electronically Signed   By: Rise Mu M.D.   On: 02/24/2015 22:51   I have personally reviewed and evaluated these images and lab results as part of my medical decision-making.   EKG Interpretation None      MDM   Final diagnoses:  Tonsillar abscess    8:15 PM Labs and CT neck pending. Vitals stable and patient afebrile.   11:35 PM Labs unremarkable for acute changes. Patient's imaging show superimposed abscess within in the right palatine tonsil. Patient will be discharged home with additional antibiotics and pain medication and instructions to follow up with ENT. Patient instructed to return with worsening or concerning symptoms. Patient discussed with Dr. Donnald Garre who agrees with plan.    Emilia Beck, PA-C 02/24/15 2342  Arby Barrette, MD 02/25/15 [phone]

## 2015-02-24 NOTE — Discharge Instructions (Signed)
Take clindamycin as directed until gone. Take ibuprofen and vicodin as needed for pain. Refer to attached documents for more information. Follow up with ENT on Monday.

## 2015-02-24 NOTE — ED Notes (Signed)
PT st's she has had a sore throat x's 4 days.  Was seen at [facility] on Thurs and was given injection of PCN. '

## 2015-02-24 NOTE — ED Notes (Signed)
Pt to CT at this time.

## 2015-02-24 NOTE — ED Notes (Signed)
Pt requesting pain med for throat pain

## 2015-05-24 ENCOUNTER — Ambulatory Visit (INDEPENDENT_AMBULATORY_CARE_PROVIDER_SITE_OTHER): Payer: 59 | Admitting: Physician Assistant

## 2015-05-24 VITALS — BP 130/90 | HR 77 | Temp 97.9°F | Resp 18 | Ht 66.0 in | Wt 254.6 lb

## 2015-05-24 DIAGNOSIS — K219 Gastro-esophageal reflux disease without esophagitis: Secondary | ICD-10-CM | POA: Insufficient documentation

## 2015-05-24 DIAGNOSIS — R1013 Epigastric pain: Secondary | ICD-10-CM

## 2015-05-24 LAB — POCT CBC
Granulocyte percent: 46.1 %G (ref 37–80)
HCT, POC: 39.9 % (ref 37.7–47.9)
HEMOGLOBIN: 13.2 g/dL (ref 12.2–16.2)
LYMPH, POC: 2.8 (ref 0.6–3.4)
MCH, POC: 26.9 pg — AB (ref 27–31.2)
MCHC: 33.1 g/dL (ref 31.8–35.4)
MCV: 81.2 fL (ref 80–97)
MID (cbc): 0.3 (ref 0–0.9)
MPV: 7 fL (ref 0–99.8)
PLATELET COUNT, POC: 302 10*3/uL (ref 142–424)
POC Granulocyte: 2.6 (ref 2–6.9)
POC LYMPH PERCENT: 49.3 %L (ref 10–50)
POC MID %: 4.6 %M (ref 0–12)
RBC: 4.91 M/uL (ref 4.04–5.48)
RDW, POC: 15.4 %
WBC: 5.6 10*3/uL (ref 4.6–10.2)

## 2015-05-24 MED ORDER — RANITIDINE HCL 150 MG PO TABS: 150.0000 mg | ORAL_TABLET | Freq: Two times a day (BID) | ORAL | Status: DC

## 2015-05-24 MED ORDER — GI COCKTAIL ~~LOC~~
30.0000 mL | Freq: Once | ORAL | Status: AC
  Administered 2015-05-24: 30 mL via ORAL

## 2015-05-24 MED ORDER — SUCRALFATE 1 GM/10ML PO SUSP: 1.0000 g | Freq: Three times a day (TID) | ORAL | Status: DC

## 2015-05-24 NOTE — Progress Notes (Signed)
   Latoya Brooks  MRN: [id number] DOB: [birth year]  Subjective:  Pt presents to clinic with epigastric pain that started about 4pm and has gotten worse since it started.  She had something similar to this several times in the past but she has not been evaluated recently for it.  She has problems with reflux and has been on protonix and feels like that helps.  She knows there are certain food that cause her problems but she has not eaten those foods today. She is having some nausea but not vomiting and she is having no change in her bowel symptoms.  She has not recently been taking motrin - the last she took was a month ago with a tooth canal.  No more burping or gas than normal. No F/C She has tried nothing for this since it started  Gallbladder removed about 7 years ago - this is a similar pain   Patient Active Problem List   Diagnosis Date Noted  . GERD (gastroesophageal reflux disease) 05/24/2015    Current Outpatient Prescriptions on File Prior to Visit  Medication Sig Dispense Refill  . pantoprazole (PROTONIX) 40 MG tablet Take 40 mg by mouth daily.     No current facility-administered medications on file prior to visit.    No Known Allergies  Review of Systems  Constitutional: Negative for fever and chills.  Gastrointestinal: Positive for nausea and abdominal pain. Negative for vomiting.  Genitourinary: Negative for menstrual problem.   Objective:  BP 130/90 mmHg  Pulse 77  Temp(Src) 97.9 F (36.6 C) (Oral)  Resp 18  Ht 5\' 6"  (1.676 m)  Wt 254 lb 9.6 oz (115.486 kg)  BMI 41.11 kg/m2  SpO2 99%  Physical Exam  Constitutional: She is oriented to person, place, and time and well-developed, well-nourished, and in no distress.  HENT:  Head: Normocephalic and atraumatic.  Right Ear: Hearing and external ear normal.  Left Ear: Hearing and external ear normal.  Eyes: Conjunctivae are normal.  Neck: Normal range of motion.  Pulmonary/Chest: Effort normal.  Abdominal:  Soft. Bowel sounds are normal. She exhibits no distension. There is tenderness (epigastric TTP). There is no rebound and no guarding.  Neurological: She is alert and oriented to person, place, and time. Gait normal.  Skin: Skin is warm and dry.  Psychiatric: Mood, memory, affect and judgment normal.  Vitals reviewed.  GI cocktail given and pain improved. Assessment and Plan :  Gastroesophageal reflux disease without esophagitis  Abdominal pain, epigastric - Plan: COMPLETE METABOLIC PANEL WITH GFR, Lipase, H. pylori breath test, POCT CBC, gi cocktail (Maalox,Lidocaine,Donnatal), sucralfate (CARAFATE) 1 GM/10ML suspension, ranitidine (ZANTAC) 150 MG tablet, Care order/instruction   Treat for gastritis while waiting on labs but encouraging that the GI cocktail treated her symptoms successfully.  D/w pt diet to help decrease her symptoms.  Benny LennertSarah Ross Hefferan PA-C  [facility] 05/24/2015 9:08 PM

## 2015-05-24 NOTE — Patient Instructions (Signed)

## 2015-05-25 LAB — COMPLETE METABOLIC PANEL WITH GFR
ALBUMIN: 4 g/dL (ref 3.6–5.1)
ALK PHOS: 56 U/L (ref 33–115)
ALT: 19 U/L (ref 6–29)
AST: 18 U/L (ref 10–30)
BILIRUBIN TOTAL: 0.3 mg/dL (ref 0.2–1.2)
BUN: 8 mg/dL (ref 7–25)
CO2: 27 mmol/L (ref 20–31)
CREATININE: 0.69 mg/dL (ref 0.50–1.10)
Calcium: 9.3 mg/dL (ref 8.6–10.2)
Chloride: 105 mmol/L (ref 98–110)
GFR, Est African American: >89 mL/min (ref >=60)
GLUCOSE: 90 mg/dL (ref 65–99)
Potassium: 4.2 mmol/L (ref 3.5–5.3)
Sodium: 139 mmol/L (ref 135–146)
TOTAL PROTEIN: 6.3 g/dL (ref 6.1–8.1)

## 2015-05-25 LAB — LIPASE: Lipase: 10 U/L (ref 7–60)

## 2015-05-28 LAB — H. PYLORI BREATH TEST: H. PYLORI BREATH TEST: DETECTED — AB

## 2015-05-30 ENCOUNTER — Telehealth: Payer: Self-pay

## 2015-05-30 ENCOUNTER — Other Ambulatory Visit: Payer: Self-pay | Admitting: Physician Assistant

## 2015-05-30 DIAGNOSIS — A048 Other specified bacterial intestinal infections: Secondary | ICD-10-CM | POA: Insufficient documentation

## 2015-05-30 MED ORDER — FLUCONAZOLE 150 MG PO TABS: 150.0000 mg | ORAL_TABLET | Freq: Once | ORAL | Status: DC

## 2015-05-30 MED ORDER — AMOXICILL-CLARITHRO-LANSOPRAZ PO MISC: Freq: Two times a day (BID) | ORAL | Status: DC

## 2015-05-30 NOTE — Telephone Encounter (Signed)
Pt wants to know if we can send in Diflucan since we are putting her on 2 antibiotics. Done.

## 2015-06-02 ENCOUNTER — Telehealth: Payer: Self-pay | Admitting: Family Medicine

## 2015-06-02 NOTE — Telephone Encounter (Signed)
Pt is calling to see if its something that she can take she's still having abd pain and she want know if she can take tums or something for the pain please respond she has H-pylori and was given a Zpak

## 2015-06-05 NOTE — Telephone Encounter (Signed)
Is the patient using the carafate - that should help.

## 2015-06-05 NOTE — Telephone Encounter (Signed)
SPoke with pt, advised message. 

## 2015-10-30 ENCOUNTER — Other Ambulatory Visit: Payer: Self-pay | Admitting: Physician Assistant

## 2016-02-21 ENCOUNTER — Encounter (HOSPITAL_COMMUNITY): Payer: Self-pay | Admitting: *Deleted

## 2016-02-21 ENCOUNTER — Inpatient Hospital Stay (HOSPITAL_COMMUNITY)
Admission: AD | Admit: 2016-02-21 | Discharge: 2016-02-21 | Disposition: A | Discharge status: Home / Self Care | Payer: 59 | Source: Ambulatory Visit | Attending: Obstetrics and Gynecology | Admitting: Obstetrics and Gynecology

## 2016-02-21 DIAGNOSIS — R102 Pelvic and perineal pain: Secondary | ICD-10-CM

## 2016-02-21 DIAGNOSIS — K219 Gastro-esophageal reflux disease without esophagitis: Secondary | ICD-10-CM | POA: Diagnosis not present

## 2016-02-21 DIAGNOSIS — F17210 Nicotine dependence, cigarettes, uncomplicated: Secondary | ICD-10-CM | POA: Diagnosis not present

## 2016-02-21 DIAGNOSIS — N730 Acute parametritis and pelvic cellulitis: Secondary | ICD-10-CM | POA: Insufficient documentation

## 2016-02-21 LAB — URINALYSIS, ROUTINE W REFLEX MICROSCOPIC
BILIRUBIN URINE: NEGATIVE
GLUCOSE, UA: NEGATIVE mg/dL
Hgb urine dipstick: NEGATIVE
Ketones, ur: NEGATIVE mg/dL
Nitrite: NEGATIVE
PH: 6 (ref 5.0–8.0)
Protein, ur: NEGATIVE mg/dL
Specific Gravity, Urine: 1.02 (ref 1.005–1.030)

## 2016-02-21 LAB — URINE MICROSCOPIC-ADD ON

## 2016-02-21 LAB — WET PREP, GENITAL
SPERM: NONE SEEN
TRICH WET PREP: NONE SEEN
YEAST WET PREP: NONE SEEN

## 2016-02-21 LAB — POCT PREGNANCY, URINE: Preg Test, Ur: NEGATIVE

## 2016-02-21 MED ORDER — CEFTRIAXONE SODIUM 250 MG IJ SOLR
250.0000 mg | Freq: Once | INTRAMUSCULAR | Status: AC
  Administered 2016-02-21: 250 mg via INTRAMUSCULAR
  Filled 2016-02-21: qty 250

## 2016-02-21 MED ORDER — FLUCONAZOLE 150 MG PO TABS: 150.0000 mg | ORAL_TABLET | Freq: Once | ORAL | 0 refills | Status: DC | PRN

## 2016-02-21 MED ORDER — IBUPROFEN 600 MG PO TABS: 600.0000 mg | ORAL_TABLET | Freq: Four times a day (QID) | ORAL | 0 refills | Status: DC | PRN

## 2016-02-21 MED ORDER — DOXYCYCLINE HYCLATE 100 MG PO CAPS: 100.0000 mg | ORAL_CAPSULE | Freq: Two times a day (BID) | ORAL | 0 refills | Status: DC

## 2016-02-21 MED ORDER — IBUPROFEN 600 MG PO TABS
600.0000 mg | ORAL_TABLET | Freq: Once | ORAL | Status: AC
  Administered 2016-02-21: 600 mg via ORAL
  Filled 2016-02-21: qty 1

## 2016-02-21 NOTE — MAU Note (Addendum)
Pt presents to MAU with complaints of lower abdominal cramping for a week. Pt was evaluated by Dr Rana SnareLowe yesterday. Pt states pain is severe today. Reports vaginal spotting. Reports pressure in her rectum

## 2016-02-21 NOTE — Discharge Instructions (Signed)
Pelvic Inflammatory Disease °Pelvic inflammatory disease (PID) refers to an infection in some or all of the female organs. The infection can be in the uterus, ovaries, fallopian tubes, or the surrounding tissues in the pelvis. PID can cause abdominal or pelvic pain that comes on suddenly (acute pelvic pain). PID is a serious infection because it can lead to lasting (chronic) pelvic pain or the inability to have children (infertility). °CAUSES °This condition is most often caused by an infection that is spread during sexual contact. However, the infection can also be caused by the normal bacteria that are found in the vaginal tissues if these bacteria travel upward into the reproductive organs. PID can also occur following: °· The birth of a baby. °· A miscarriage. °· An abortion. °· Major pelvic surgery. °· The use of an intrauterine device (IUD). °· A sexual assault. °RISK FACTORS °This condition is more likely to develop in women who: °· Are younger than 42 years of age. °· Are sexually active at a young age. °· Use nonbarrier contraception. °· Have multiple sexual partners. °· Have sex with someone who has symptoms of an STD (sexually transmitted disease). °· Use oral contraception. °At times, certain behaviors can also increase the possibility of getting PID, such as: °· Using a vaginal douche. °· Having an IUD in place. °SYMPTOMS °Symptoms of this condition include: °· Abdominal or pelvic pain. °· Fever. °· Chills. °· Abnormal vaginal discharge. °· Abnormal uterine bleeding. °· Unusual pain shortly after the end of a menstrual period. °· Painful urination. °· Pain with sexual intercourse. °· Nausea and vomiting. °DIAGNOSIS °To diagnose this condition, your health care provider will do a physical exam and take your medical history. A pelvic exam typically reveals great tenderness in the uterus and the surrounding pelvic tissues. You may also have tests, such as: °· Lab tests, including a pregnancy test, blood  tests, and urine test. °· Culture tests of the vagina and cervix to check for an STD. °· Ultrasound. °· A laparoscopic procedure to look inside the pelvis. °· Examining vaginal secretions under a microscope. °TREATMENT °Treatment for this condition may involve one or more approaches. °· Antibiotic medicines may be prescribed to be taken by mouth. °· Sexual partners may need to be treated if the infection is caused by an STD. °· For more severe cases, hospitalization may be needed to give antibiotics directly into a vein through an IV tube. °· Surgery may be needed if other treatments do not help, but this is rare. °It may take weeks until you are completely well. If you are diagnosed with PID, you should also be checked for human immunodeficiency virus (HIV). Your health care provider may test you for infection again 3 months after treatment. You should not have unprotected sex. °HOME CARE INSTRUCTIONS °· Take over-the-counter and prescription medicines only as told by your health care provider. °· If you were prescribed an antibiotic medicine, take it as told by your health care provider. Do not stop taking the antibiotic even if you start to feel better. °· Do not have sexual intercourse until treatment is completed or as told by your health care provider. If PID is confirmed, your recent sexual partners will need treatment, especially if you had unprotected sex. °· Keep all follow-up visits as told by your health care provider. This is important. °SEEK MEDICAL CARE IF: °· You have increased or abnormal vaginal discharge. °· Your pain does not improve. °· You vomit. °· You have a fever. °· You   cannot tolerate your medicines. °· Your partner has an STD. °· You have pain when you urinate. °SEEK IMMEDIATE MEDICAL CARE IF: °· You have increased abdominal or pelvic pain. °· You have chills. °· Your symptoms are not better in 72 hours even with treatment. °  °This information is not intended to replace advice given to  you by your health care provider. Make sure you discuss any questions you have with your health care provider. °  °Document Released: 04/28/2005 Document Revised: 01/17/2015 Document Reviewed: 06/05/2014 °Elsevier Interactive Patient Education ©2016 [employer] ° °Pelvic Pain, Female °Female pelvic pain can be caused by many different things and start from a variety of places. Pelvic pain refers to pain that is located in the lower half of the abdomen and between your hips. The pain may occur over a short period of time (acute) or may be reoccurring (chronic). The cause of pelvic pain may be related to disorders affecting the female reproductive organs (gynecologic), but it may also be related to the bladder, kidney stones, an intestinal complication, or muscle or skeletal problems. Getting help right away for pelvic pain is important, especially if there has been severe, sharp, or a sudden onset of unusual pain. It is also important to get help right away because some types of pelvic pain can be life threatening.  °CAUSES  °Below are only some of the causes of pelvic pain. The causes of pelvic pain can be in one of several categories.  °· Gynecologic. °¨ Pelvic inflammatory disease. °¨ Sexually transmitted infection. °¨ Ovarian cyst or a twisted ovarian ligament (ovarian torsion). °¨ Uterine lining that grows outside the uterus (endometriosis). °¨ Fibroids, cysts, or tumors. °¨ Ovulation. °· Pregnancy. °¨ Pregnancy that occurs outside the uterus (ectopic pregnancy). °¨ Miscarriage. °¨ Labor. °¨ Abruption of the placenta or ruptured uterus. °· Infection. °¨ Uterine infection (endometritis). °¨ Bladder infection. °¨ Diverticulitis. °¨ Miscarriage related to a uterine infection (septic abortion). °· Bladder. °¨ Inflammation of the bladder (cystitis). °¨ Kidney stone(s). °· Gastrointestinal. °¨ Constipation. °¨ Diverticulitis. °· Neurologic. °¨ Trauma. °¨ Feeling pelvic pain because of mental or emotional causes  (psychosomatic). °· Cancers of the bowel or pelvis. °EVALUATION  °Your caregiver will want to take a careful history of your concerns. This includes recent changes in your health, a careful gynecologic history of your periods (menses), and a sexual history. Obtaining your family history and medical history is also important. Your caregiver may suggest a pelvic exam. A pelvic exam will help identify the location and severity of the pain. It also helps in the evaluation of which organ system may be involved. In order to identify the cause of the pelvic pain and be properly treated, your caregiver may order tests. These tests may include:  °· A pregnancy test. °· Pelvic ultrasonography. °· An X-ray exam of the abdomen. °· A urinalysis or evaluation of vaginal discharge. °· Blood tests. °HOME CARE INSTRUCTIONS  °· Only take over-the-counter or prescription medicines for pain, discomfort, or fever as directed by your caregiver.   °· Rest as directed by your caregiver.   °· Eat a balanced diet.   °· Drink enough fluids to make your urine clear or pale yellow, or as directed.   °· Avoid sexual intercourse if it causes pain.   °· Apply warm or cold compresses to the lower abdomen depending on which one helps the pain.   °· Avoid stressful situations.   °· Keep a journal of your pelvic pain. Write down when it started, where the pain is   located, and if there are things that seem to be associated with the pain, such as food or your menstrual cycle. °· Follow up with your caregiver as directed.   °SEEK MEDICAL CARE IF: °· Your medicine does not help your pain. °· You have abnormal vaginal discharge. °SEEK IMMEDIATE MEDICAL CARE IF:  °· You have heavy bleeding from the vagina.   °· Your pelvic pain increases.   °· You feel light-headed or faint.   °· You have chills.   °· You have pain with urination or blood in your urine.   °· You have uncontrolled diarrhea or vomiting.   °· You have a fever or persistent symptoms for more  than 3 days. °· You have a fever and your symptoms suddenly get worse.   °· You are being physically or sexually abused. °  °This information is not intended to replace advice given to you by your health care provider. Make sure you discuss any questions you have with your health care provider. °  °Document Released: 03/25/2004 Document Revised: 01/17/2015 Document Reviewed: 08/18/2011 °Elsevier Interactive Patient Education ©2016 Elsevier Inc. ° °

## 2016-02-21 NOTE — MAU Provider Note (Signed)
History     CSN: [id number]  Arrival date and time: 02/21/16 8891   First Provider Initiated Contact with Patient 02/21/16 0902      Chief Complaint  Patient presents with  . Pelvic Pain   Latoya Brooks is a 40 y.o. Female who presents with pelvic pain. Began with LMP on 10/3 & has worsened since this morning. Saw Dr. Corinna Capra in office yesterday; states had a normal pelvic exam yesterday and was going to have an ultrasound later this month to evaluate for fibroids. Currently rates pain 8/10. Has not treated today. Took ibuprofen last week which relieved symptoms. No aggravating factors.    Pelvic Pain  The patient's primary symptoms include pelvic pain. The patient's pertinent negatives include no genital itching, genital odor, missed menses, vaginal bleeding or vaginal discharge. Episode onset: x10 days. The problem occurs constantly. The problem has been rapidly worsening. The problem affects both sides. She is not pregnant. Associated symptoms include abdominal pain. Pertinent negatives include no back pain, chills, constipation, diarrhea, dysuria, fever, frequency, hematuria, nausea, painful intercourse (no intercourse x 1+ month) or vomiting. Nothing aggravates the symptoms. She has tried NSAIDs for the symptoms. The treatment provided moderate relief. She is sexually active. It is unknown whether or not her partner has an STD. She uses tubal ligation for contraception. Her menstrual history has been regular. Her past medical history is significant for an STD. There is no history of menorrhagia or PID.   Pertinent Gynecological History: Menses: regular every month without intermenstrual spotting Contraception: tubal ligation Sexually transmitted diseases: past history: years ago of treatable STIs; unsure which ones    Past Medical History:  Diagnosis Date  . GERD (gastroesophageal reflux disease)    occasionally - med prn  . Preterm labor   . SVD (spontaneous vaginal delivery)    x  3    Past Surgical History:  Procedure Laterality Date  . CHOLECYSTECTOMY    . LAPAROSCOPIC TUBAL LIGATION Bilateral 07/08/2013   Procedure: LAPAROSCOPIC TUBAL LIGATION with Filshie clips;  Surgeon: Luz Lex, MD;  Location: [facility];  Service: Gynecology;  Laterality: Bilateral;  . WISDOM TOOTH EXTRACTION     general anesthesia    History reviewed. No pertinent family history.  Social History  Substance Use Topics  . Smoking status: Current Some Day Smoker    Packs/day: 0.25    Years: 20.00    Types: Cigarettes  . Smokeless tobacco: Never Used  . Alcohol use 0.0 oz/week     Comment: occasional    Allergies: No Known Allergies  Prescriptions Prior to Admission  Medication Sig Dispense Refill Last Dose  . amoxicillin-clarithromycin-lansoprazole (PREVPAC) combo pack Take by mouth 2 (two) times daily. Follow package directions. 1 kit 0   . fluconazole (DIFLUCAN) 150 MG tablet Take 1 tablet (150 mg total) by mouth once. 1 tablet 0   . pantoprazole (PROTONIX) 40 MG tablet Take 40 mg by mouth daily.   Taking  . ranitidine (ZANTAC) 150 MG tablet TAKE 1 TABLET BY MOUTH TWICE A DAY 60 tablet 0   . sucralfate (CARAFATE) 1 GM/10ML suspension Take 10 mLs (1 g total) by mouth 4 (four) times daily -  with meals and at bedtime. 420 mL 0     Review of Systems  Constitutional: Negative for chills and fever.  Gastrointestinal: Positive for abdominal pain. Negative for constipation, diarrhea, nausea and vomiting.  Genitourinary: Positive for pelvic pain. Negative for dysuria, frequency, hematuria, menorrhagia, missed menses and vaginal discharge.  No vaginal discharge, bleeding, dyspareunia, or postcoital bleeding.  Musculoskeletal: Negative for back pain.   Physical Exam   Blood pressure 133/72, pulse 63, temperature 98.5 F (36.9 C), resp. rate 18, last menstrual period 02/12/2016.  Physical Exam  Nursing note and vitals reviewed. Constitutional: She is oriented to person,  place, and time. She appears well-developed and well-nourished. No distress.  HENT:  Head: Normocephalic and atraumatic.  Eyes: Conjunctivae are normal. Right eye exhibits no discharge. Left eye exhibits no discharge. No scleral icterus.  Neck: Normal range of motion.  Cardiovascular: Normal rate, regular rhythm and normal heart sounds.   No murmur heard. Respiratory: Effort normal and breath sounds normal. No respiratory distress. She has no wheezes.  GI: Soft. Bowel sounds are normal. She exhibits no distension. There is no tenderness. There is no rebound and no guarding.  Genitourinary: Uterus normal. Cervix exhibits motion tenderness and discharge (minimal amount of thin clear discharge). Cervix exhibits no friability. Right adnexum displays no mass and no tenderness. Left adnexum displays no mass and no tenderness.  Neurological: She is alert and oriented to person, place, and time.  Skin: Skin is warm and dry. She is not diaphoretic.  Psychiatric: She has a normal mood and affect. Her behavior is normal. Judgment and thought content normal.    MAU Course  Procedures Results for orders placed or performed during the hospital encounter of 02/21/16 (from the past 24 hour(s))  Urinalysis, Routine w reflex microscopic (not at [facility])     Status: Abnormal   Collection Time: 02/21/16  8:00 AM  Result Value Ref Range   Color, Urine YELLOW YELLOW   APPearance CLEAR CLEAR   Specific Gravity, Urine 1.020 1.005 - 1.030   pH 6.0 5.0 - 8.0   Glucose, UA NEGATIVE NEGATIVE mg/dL   Hgb urine dipstick NEGATIVE NEGATIVE   Bilirubin Urine NEGATIVE NEGATIVE   Ketones, ur NEGATIVE NEGATIVE mg/dL   Protein, ur NEGATIVE NEGATIVE mg/dL   Nitrite NEGATIVE NEGATIVE   Leukocytes, UA TRACE (A) NEGATIVE  Urine microscopic-add on     Status: Abnormal   Collection Time: 02/21/16  8:00 AM  Result Value Ref Range   Squamous Epithelial / LPF 0-5 (A) NONE SEEN   WBC, UA 0-5 0 - 5 WBC/hpf   RBC / HPF 0-5 0 - 5  RBC/hpf   Bacteria, UA FEW (A) NONE SEEN  Pregnancy, urine POC     Status: None   Collection Time: 02/21/16  8:33 AM  Result Value Ref Range   Preg Test, Ur NEGATIVE NEGATIVE  Wet prep, genital     Status: Abnormal   Collection Time: 02/21/16  9:01 AM  Result Value Ref Range   Yeast Wet Prep HPF POC NONE SEEN NONE SEEN   Trich, Wet Prep NONE SEEN NONE SEEN   Clue Cells Wet Prep HPF POC PRESENT (A) NONE SEEN   WBC, Wet Prep HPF POC MODERATE (A) NONE SEEN   Sperm NONE SEEN     MDM Pt declined bloodwork Ibuprofen 600 mg PO for pain ----- pain resolved prior to discharge GC/CT & wet prep +CMT & moderate WBC on wet prep; will tx for PID Rocephin 250 mg IM Discussed with Dr. Julien Girt; will continue with plan for PID tx, rx ibuprofen & office to call pt for f/u ultrasound  Assessment and Plan  A: 1. Pelvic pain in female   2. PID (acute pelvic inflammatory disease)    P: Discharge home Rx doxycycline & ibuprofen Rx diflucan  prn per patient request Discussed reasons to return to MAU Keep scheduled f/u with office -- office should call pt to reschedule ultrasound  Jorje Guild 02/21/2016, 8:25 AM

## 2016-02-22 LAB — GC/CHLAMYDIA PROBE AMP (~~LOC~~) NOT AT ARMC
CHLAMYDIA, DNA PROBE: NEGATIVE
Neisseria Gonorrhea: NEGATIVE

## 2016-02-26 ENCOUNTER — Inpatient Hospital Stay (HOSPITAL_COMMUNITY)
Admission: AD | Admit: 2016-02-26 | Discharge: 2016-02-26 | Disposition: A | Discharge status: Home / Self Care | Payer: 59 | Source: Ambulatory Visit | Attending: Emergency Medicine | Admitting: Emergency Medicine

## 2016-02-26 ENCOUNTER — Inpatient Hospital Stay (HOSPITAL_COMMUNITY): Payer: 59

## 2016-02-26 ENCOUNTER — Encounter (HOSPITAL_COMMUNITY): Payer: Self-pay | Admitting: *Deleted

## 2016-02-26 DIAGNOSIS — R350 Frequency of micturition: Secondary | ICD-10-CM | POA: Insufficient documentation

## 2016-02-26 DIAGNOSIS — R1011 Right upper quadrant pain: Secondary | ICD-10-CM | POA: Diagnosis not present

## 2016-02-26 DIAGNOSIS — F17210 Nicotine dependence, cigarettes, uncomplicated: Secondary | ICD-10-CM | POA: Insufficient documentation

## 2016-02-26 DIAGNOSIS — R1010 Upper abdominal pain, unspecified: Secondary | ICD-10-CM | POA: Diagnosis not present

## 2016-02-26 DIAGNOSIS — R110 Nausea: Secondary | ICD-10-CM | POA: Insufficient documentation

## 2016-02-26 DIAGNOSIS — R1013 Epigastric pain: Secondary | ICD-10-CM | POA: Diagnosis not present

## 2016-02-26 DIAGNOSIS — R748 Abnormal levels of other serum enzymes: Secondary | ICD-10-CM

## 2016-02-26 DIAGNOSIS — F17290 Nicotine dependence, other tobacco product, uncomplicated: Secondary | ICD-10-CM | POA: Insufficient documentation

## 2016-02-26 DIAGNOSIS — R945 Abnormal results of liver function studies: Secondary | ICD-10-CM | POA: Diagnosis not present

## 2016-02-26 DIAGNOSIS — Z79899 Other long term (current) drug therapy: Secondary | ICD-10-CM | POA: Diagnosis not present

## 2016-02-26 HISTORY — DX: Female pelvic inflammatory disease, unspecified: N73.9

## 2016-02-26 HISTORY — DX: Dysmenorrhea, unspecified: N94.6

## 2016-02-26 LAB — URINALYSIS, ROUTINE W REFLEX MICROSCOPIC
Bilirubin Urine: NEGATIVE
Glucose, UA: NEGATIVE mg/dL
HGB URINE DIPSTICK: NEGATIVE
Ketones, ur: NEGATIVE mg/dL
Leukocytes, UA: NEGATIVE
Nitrite: NEGATIVE
Protein, ur: NEGATIVE mg/dL
SPECIFIC GRAVITY, URINE: 1.015 (ref 1.005–1.030)
pH: 6 (ref 5.0–8.0)

## 2016-02-26 LAB — COMPREHENSIVE METABOLIC PANEL
ALT: 60 U/L — AB (ref 14–54)
AST: 104 U/L — ABNORMAL HIGH (ref 15–41)
Albumin: 3.7 g/dL (ref 3.5–5.0)
Alkaline Phosphatase: 78 U/L (ref 38–126)
Anion gap: 9 (ref 5–15)
BILIRUBIN TOTAL: 0.7 mg/dL (ref 0.3–1.2)
BUN: 8 mg/dL (ref 6–20)
CO2: 21 mmol/L — ABNORMAL LOW (ref 22–32)
CREATININE: 0.59 mg/dL (ref 0.44–1.00)
Calcium: 8.9 mg/dL (ref 8.9–10.3)
Chloride: 105 mmol/L (ref 101–111)
GFR calc Af Amer: >60 mL/min (ref >60)
Glucose, Bld: 105 mg/dL — ABNORMAL HIGH (ref 65–99)
Potassium: 3.8 mmol/L (ref 3.5–5.1)
Sodium: 135 mmol/L (ref 135–145)
TOTAL PROTEIN: 7.3 g/dL (ref 6.5–8.1)

## 2016-02-26 LAB — AMYLASE: AMYLASE: 68 U/L (ref 28–100)

## 2016-02-26 LAB — CBC
HEMATOCRIT: 38.2 % (ref 36.0–46.0)
Hemoglobin: 13.1 g/dL (ref 12.0–15.0)
MCH: 27 pg (ref 26.0–34.0)
MCHC: 34.3 g/dL (ref 30.0–36.0)
MCV: 78.6 fL (ref 78.0–100.0)
Platelets: 336 10*3/uL (ref 150–400)
RBC: 4.86 MIL/uL (ref 3.87–5.11)
RDW: 14.9 % (ref 11.5–15.5)
WBC: 6.6 10*3/uL (ref 4.0–10.5)

## 2016-02-26 LAB — POCT PREGNANCY, URINE: Preg Test, Ur: NEGATIVE

## 2016-02-26 LAB — ACETAMINOPHEN LEVEL: Acetaminophen (Tylenol), Serum: <10 ug/mL — ABNORMAL LOW (ref 10–30)

## 2016-02-26 LAB — LIPASE, BLOOD: Lipase: 18 U/L (ref 11–51)

## 2016-02-26 MED ORDER — GI COCKTAIL ~~LOC~~
30.0000 mL | Freq: Once | ORAL | Status: AC
  Administered 2016-02-26: 30 mL via ORAL
  Filled 2016-02-26: qty 30

## 2016-02-26 MED ORDER — PANTOPRAZOLE SODIUM 40 MG PO TBEC
40.0000 mg | DELAYED_RELEASE_TABLET | Freq: Every day | ORAL | Status: DC
  Filled 2016-02-26: qty 1

## 2016-02-26 NOTE — Progress Notes (Signed)
Patient listed a snot having a [facility].  EDCM provided patient with list of providers who [facility] within a 20 mile radius of patient's zip code.  Patient thankful for services.  No further EDCM needs at this time.

## 2016-02-26 NOTE — Discharge Instructions (Signed)
Please take all medicines with food Avoid Ibuprofen as it can make gastritis worse Follow up with GI if symptoms are not improving for further evaluation and possible endoscopy

## 2016-02-26 NOTE — ED Triage Notes (Signed)
Pt presents with c/o epigastric pain that started this morning. [facility] and given protonix and GI cocktail. Pt was sent over here for possible diagnosis of pancreatitis. Pt denies any V/D but does report some nausea. [facility] had [facility] liver [facility]. Pt is tearful in triage, reporting that her mother passed from liver CA.

## 2016-02-26 NOTE — ED Notes (Signed)
Pt obtaining urine sample in triage

## 2016-02-26 NOTE — MAU Provider Note (Signed)
History     CSN: [id number]  Arrival date and time: 02/26/16 0932   None     Chief Complaint  Patient presents with  . Abdominal Pain   Non-pregnant female here with c/o upper abdominal pain since 0600. She describes as sharp. She reports nausea since onset of pain, no vomiting. She took Ibuprofen and Carfate without any relief. She denies C/D. She denies fever or chills. Seen 5 days ago in MAU with lower abdominal pain and being tx for PID. LMP 02/13/16.    Past Medical History:  Diagnosis Date  . GERD (gastroesophageal reflux disease)    occasionally - med prn  . Preterm labor   . SVD (spontaneous vaginal delivery)    x 3    Past Surgical History:  Procedure Laterality Date  . CHOLECYSTECTOMY    . LAPAROSCOPIC TUBAL LIGATION Bilateral 07/08/2013   Procedure: LAPAROSCOPIC TUBAL LIGATION with Filshie clips;  Surgeon: Turner Danielsavid C Lowe, MD;  Location: [facility];  Service: Gynecology;  Laterality: Bilateral;  . WISDOM TOOTH EXTRACTION     general anesthesia    No family history on file.  Social History  Substance Use Topics  . Smoking status: Current Some Day Smoker    Packs/day: 0.25    Years: 20.00    Types: Cigarettes  . Smokeless tobacco: Never Used  . Alcohol use 0.0 oz/week     Comment: occasional    Allergies: No Known Allergies  Prescriptions Prior to Admission  Medication Sig Dispense Refill Last Dose  . doxycycline (VIBRAMYCIN) 100 MG capsule Take 1 capsule (100 mg total) by mouth 2 (two) times daily. 28 capsule 0   . fluconazole (DIFLUCAN) 150 MG tablet Take 1 tablet (150 mg total) by mouth once as needed. 1 tablet 0   . ibuprofen (ADVIL,MOTRIN) 600 MG tablet Take 1 tablet (600 mg total) by mouth every 6 (six) hours as needed. 30 tablet 0   . pantoprazole (PROTONIX) 40 MG tablet Take 40 mg by mouth daily.   02/20/2016 at Unknown time    ROS Physical Exam   Blood pressure 157/93, pulse 76, temperature 97.8 F (36.6 C), temperature source Oral, resp. rate  20, weight 110.9 kg (244 lb 8 oz), last menstrual period 02/12/2016.  Physical Exam  Constitutional: She is oriented to person, place, and time. She appears well-developed and well-nourished.  HENT:  Head: Normocephalic and atraumatic.  Neck: Normal range of motion. Neck supple.  Cardiovascular: Normal rate.   Respiratory: Effort normal.  GI: Soft. She exhibits no distension and no mass. There is tenderness (mid upper abd). There is no rebound and no guarding.  Musculoskeletal: Normal range of motion.  Neurological: She is alert and oriented to person, place, and time.  Skin: Skin is warm and dry.  Psychiatric: She has a normal mood and affect.   Results for orders placed or performed during the hospital encounter of 02/26/16 (from the past 24 hour(s))  Urinalysis, Routine w reflex microscopic (not at [facility])     Status: None   Collection Time: 02/26/16  9:45 AM  Result Value Ref Range   Color, Urine YELLOW YELLOW   APPearance CLEAR CLEAR   Specific Gravity, Urine 1.015 1.005 - 1.030   pH 6.0 5.0 - 8.0   Glucose, UA NEGATIVE NEGATIVE mg/dL   Hgb urine dipstick NEGATIVE NEGATIVE   Bilirubin Urine NEGATIVE NEGATIVE   Ketones, ur NEGATIVE NEGATIVE mg/dL   Protein, ur NEGATIVE NEGATIVE mg/dL   Nitrite NEGATIVE NEGATIVE   Leukocytes,  UA NEGATIVE NEGATIVE  CBC     Status: None   Collection Time: 02/26/16 10:26 AM  Result Value Ref Range   WBC 6.6 4.0 - 10.5 K/uL   RBC 4.86 3.87 - 5.11 MIL/uL   Hemoglobin 13.1 12.0 - 15.0 g/dL   HCT 96.0 45.4 - 09.8 %   MCV 78.6 78.0 - 100.0 fL   MCH 27.0 26.0 - 34.0 pg   MCHC 34.3 30.0 - 36.0 g/dL   RDW 11.9 14.7 - 82.9 %   Platelets 336 150 - 400 K/uL  Comprehensive metabolic panel     Status: Abnormal   Collection Time: 02/26/16 10:26 AM  Result Value Ref Range   Sodium 135 135 - 145 mmol/L   Potassium 3.8 3.5 - 5.1 mmol/L   Chloride 105 101 - 111 mmol/L   CO2 21 (L) 22 - 32 mmol/L   Glucose, Bld 105 (H) 65 - 99 mg/dL   BUN 8 6 - 20 mg/dL    Creatinine, Ser 5.62 0.44 - 1.00 mg/dL   Calcium 8.9 8.9 - 13.0 mg/dL   Total Protein 7.3 6.5 - 8.1 g/dL   Albumin 3.7 3.5 - 5.0 g/dL   AST 865 (H) 15 - 41 U/L   ALT 60 (H) 14 - 54 U/L   Alkaline Phosphatase 78 38 - 126 U/L   Total Bilirubin 0.7 0.3 - 1.2 mg/dL   GFR calc non Af Amer >60 >60 mL/min   GFR calc Af Amer >60 >60 mL/min   Anion gap 9 5 - 15  Amylase     Status: None   Collection Time: 02/26/16 10:26 AM  Result Value Ref Range   Amylase 68 28 - 100 U/L  Lipase, blood     Status: None   Collection Time: 02/26/16 10:26 AM  Result Value Ref Range   Lipase 18 11 - 51 U/L  Pregnancy, urine POC     Status: None   Collection Time: 02/26/16 10:31 AM  Result Value Ref Range   Preg Test, Ur NEGATIVE NEGATIVE    MAU Course  Procedures Gi cocktail Protonix  MDM Labs ordered and reviewed. Discussed presentation, clinical and labs findings with Dr. Vincente Poli. Recommend transfer to [facility] for further eval. Pt reports some improvement of pain after meds and agrees to transfer. Spoke with Dr. Fayrene Fearing at [facility], accepts transfer. Stable for transfer via private vehicle.   Assessment and Plan   1. Upper abdominal pain   2.      Elevated liver enzymes  Transfer to [employer], CNM 02/26/2016, 9:55 AM

## 2016-02-26 NOTE — ED Notes (Signed)
Bed: [facility]1 Expected date:  Expected time:  Means of arrival:  Comments: 

## 2016-02-26 NOTE — ED Provider Notes (Signed)
[facility]-EMERGENCY DEPT Provider Note   CSN: [id number] Arrival date & time: 02/26/16  0930     History   Chief Complaint Chief Complaint  Patient presents with  . Abdominal Pain    HPI [place] Buss is a 40 y.o. female who presents with epigastric pain. PMH significant for GERD and is currently being treated for PID. She has also been treated for H. Pylori in the past (in Jan). Past surgical hx significant for cholecystectomy and tubal ligation. She states she was treated by [facility] due to pelvic pain. Of note, G&C was negative. She has been taking Doxy and took it on an empty stomach last night. She states she has also been taking large quantities of Ibuprofen due to pelvic pain. She woke up this morning at 6AM with the pain. She ate breakfast and her pain became intense and severe. She left work and went to [employer] who were concerned for pancreatitis therefore was sent here for further evaluation. The pain is in the epigastric area and radiates to the RUQ. It is constant. Reports associated nausea. She received a GI cocktail and her pain has gotten better. Denies fever, chills, chest pain, SOB, lower abdominal pain, vomiting, constipation, diarrhea, hematochezia/melena. She has some urinary frequency without dysuria or hematuria. She has been taking Norco however takes it sparingly and does not take Tylenol regularly. Denies IVDU or alcohol use. She takes Protonix daily.  HPI  Past Medical History:  Diagnosis Date  . Dysmenorrhea   . GERD (gastroesophageal reflux disease)    occasionally - med prn  . Pelvic inflammatory disease (PID)   . Preterm labor   . SVD (spontaneous vaginal delivery)    x 3    Patient Active Problem List   Diagnosis Date Noted  . H. pylori infection 05/30/2015  . GERD (gastroesophageal reflux disease) 05/24/2015    Past Surgical History:  Procedure Laterality Date  . CHOLECYSTECTOMY    . LAPAROSCOPIC TUBAL LIGATION Bilateral 07/08/2013   Procedure: LAPAROSCOPIC TUBAL LIGATION with Filshie clips;  Surgeon: Turner Daniels, MD;  Location: [facility];  Service: Gynecology;  Laterality: Bilateral;  . WISDOM TOOTH EXTRACTION     general anesthesia    OB History    Gravida Para Term Preterm AB Living   5 3 2 1 2 3    SAB TAB Ectopic Multiple Live Births   0 2 0 0 3       Home Medications    Prior to Admission medications   Medication Sig Start Date End Date Taking? Authorizing Provider  doxycycline (VIBRAMYCIN) 100 MG capsule Take 1 capsule (100 mg total) by mouth 2 (two) times daily. 02/21/16  Yes Judeth Horn, NP  Hydrocodone-Acetaminophen 5-300 MG TABS Take 0.5-1 tablets by mouth every 6 (six) hours as needed (pain).  02/21/16  Yes Historical Provider, MD  ibuprofen (ADVIL,MOTRIN) 600 MG tablet Take 1 tablet (600 mg total) by mouth every 6 (six) hours as needed. Patient taking differently: Take 600 mg by mouth every 6 (six) hours as needed for moderate pain.  02/21/16  Yes Judeth Horn, NP  pantoprazole (PROTONIX) 40 MG tablet Take 40 mg by mouth daily.   Yes Historical Provider, MD  sucralfate (CARAFATE) 1 GM/10ML suspension Take 1 g by mouth 3 (three) times daily as needed (GI pain).   Yes Historical Provider, MD  fluconazole (DIFLUCAN) 150 MG tablet Take 1 tablet (150 mg total) by mouth once as needed. Patient not taking: Reported on 02/26/2016 02/21/16  Judeth Horn, NP    Family History History reviewed. No pertinent family history.  Social History Social History  Substance Use Topics  . Smoking status: Current Some Day Smoker    Packs/day: 0.25    Years: 20.00    Types: Cigarettes  . Smokeless tobacco: Current User  . Alcohol use 0.0 oz/week     Comment: occasional     Allergies   Review of patient's allergies indicates no known allergies.   Review of Systems Review of Systems  Constitutional: Negative for chills and fever.  Respiratory: Negative for shortness of breath.   Cardiovascular: Negative  for chest pain.  Gastrointestinal: Positive for abdominal pain and nausea. Negative for constipation, diarrhea and vomiting.  Genitourinary: Positive for frequency. Negative for dysuria, hematuria and pelvic pain.  All other systems reviewed and are negative.    Physical Exam Updated Vital Signs BP 134/74 (BP Location: Left Arm)   Pulse 90   Temp 98.2 F (36.8 C) (Oral)   Resp 16   Ht 5\' 6"  (1.676 m)   Wt 105.7 kg   LMP 02/12/2016 (Exact Date)   SpO2 100%   BMI 37.61 kg/m   Physical Exam  Constitutional: She is oriented to person, place, and time. She appears well-developed and well-nourished. No distress.  Obese, calm, cooperative, pleasant  HENT:  Head: Normocephalic and atraumatic.  Eyes: Conjunctivae are normal. Pupils are equal, round, and reactive to light. Right eye exhibits no discharge. Left eye exhibits no discharge. No scleral icterus.  Neck: Normal range of motion. Neck supple.  Cardiovascular: Normal rate and regular rhythm.  Exam reveals no gallop and no friction rub.   No murmur heard. Pulmonary/Chest: Effort normal and breath sounds normal. No respiratory distress. She has no wheezes. She has no rales. She exhibits no tenderness.  Abdominal: Soft. Bowel sounds are normal. She exhibits no distension and no mass. There is tenderness. There is no rebound and no guarding. No hernia.  Moderate epigastric tenderness and mild RUQ tenderness. Cholecystectomy scar noted  Musculoskeletal: She exhibits no edema.  Neurological: She is alert and oriented to person, place, and time.  Skin: Skin is warm and dry.  Psychiatric: She has a normal mood and affect. Her behavior is normal.  Nursing note and vitals reviewed.    ED Treatments / Results  Labs (all labs ordered are listed, but only abnormal results are displayed) Labs Reviewed  COMPREHENSIVE METABOLIC PANEL - Abnormal; Notable for the following:       Result Value   CO2 21 (*)    Glucose, Bld 105 (*)    AST 104  (*)    ALT 60 (*)    All other components within normal limits  ACETAMINOPHEN LEVEL - Abnormal; Notable for the following:    Acetaminophen (Tylenol), Serum <10 (*)    All other components within normal limits  URINALYSIS, ROUTINE W REFLEX MICROSCOPIC (NOT AT [facility])  CBC  AMYLASE  LIPASE, BLOOD  HEPATITIS PANEL, ACUTE  POCT PREGNANCY, URINE    EKG  EKG Interpretation None       Radiology No results found.  Procedures Procedures (including critical care time)  Medications Ordered in ED Medications  pantoprazole (PROTONIX) EC tablet 40 mg (40 mg Oral Not Given 02/26/16 1056)  gi cocktail (Maalox,Lidocaine,Donnatal) (30 mLs Oral Given 02/26/16 1052)     Initial Impression / Assessment and Plan / ED Course  I have reviewed the triage vital signs and the nursing notes.  Pertinent labs & imaging  results that were available during my care of the patient were reviewed by me and considered in my medical decision making (see chart for details).  Clinical Course   40 year old female presents with epigastric pain most likely from gastritis due to increased NSAID use and not taking medicines with food. Transaminases are mildly elevated. RUQ US is negative. Hepatitis panel sent. Patient is afebrile, not tachycardic or tachypneic, normotensive, and not hypoxic. CBC unremarkable. CMP remarkable for CO 21, glucose 105, AST 104 and ALT 60. Preg test neg. Tylenol <10. UA is clean. Will d/c with GI follow up. Advised d/c NSAIDs, continue PPI, take all meds with food. Patient is NAD, non-toxic, with stable VS. Patient is informed of clinical course, understands medical decision making process, and agrees with plan. Opportunity for questions provided and all questions answered. Return precautions given.    Final Clinical Impressions(s) / ED Diagnoses   Final diagnoses:  Elevated liver enzymes  Epigastric pain    New Prescriptions Current Discharge Medication List       Bethel BornKelly Marie  Gekas, PA-C 02/26/16 1749    Charlynne Panderavid Hsienta Yao, MD 02/27/16 1128

## 2016-02-26 NOTE — MAU Note (Signed)
Pain in upper abd, started this morning when she woke up. Sharp and constant.  Denies vomiting or diarrhea. Took a motrin 600mg , not helping

## 2016-02-27 LAB — HEPATITIS PANEL, ACUTE
HCV AB: 0.1 {s_co_ratio} (ref 0.0–0.9)
HEP B S AG: NEGATIVE
Hep A IgM: NEGATIVE
Hep B C IgM: NEGATIVE

## 2016-07-16 ENCOUNTER — Ambulatory Visit (INDEPENDENT_AMBULATORY_CARE_PROVIDER_SITE_OTHER): Payer: 59 | Admitting: Physician Assistant

## 2016-07-16 VITALS — BP 129/79 | HR 97 | Temp 98.3°F | Resp 17 | Ht 66.5 in | Wt 245.0 lb

## 2016-07-16 DIAGNOSIS — R599 Enlarged lymph nodes, unspecified: Secondary | ICD-10-CM | POA: Diagnosis not present

## 2016-07-16 MED ORDER — AMOXICILLIN-POT CLAVULANATE 875-125 MG PO TABS: 1.0000 | ORAL_TABLET | Freq: Two times a day (BID) | ORAL | 0 refills | Status: AC

## 2016-07-16 NOTE — Patient Instructions (Addendum)
Please take ibuprofen or tylenol for pain or fever.   You will return in 2 weeks if your symptoms do not improve.  Sooner if you develop worsening symptoms listed below. Lymphadenopathy Lymphadenopathy refers to swollen or enlarged lymph glands, also called lymph nodes. Lymph glands are part of your body's defense (immune) system, which protects the body from infections, germs, and diseases. Lymph glands are found in many locations in your body, including the neck, underarm, and groin. Many things can cause lymph glands to become enlarged. When your immune system responds to germs, such as viruses or bacteria, infection-fighting cells and fluid build up. This causes the glands to grow in size. Usually, this is not something to worry about. The swelling and any soreness often go away without treatment. However, swollen lymph glands can also be caused by a number of diseases. Your health care provider may do various tests to help determine the cause. If the cause of your swollen lymph glands cannot be found, it is important to monitor your condition to make sure the swelling goes away. Follow these instructions at home: Watch your condition for any changes. The following actions may help to lessen any discomfort you are feeling:  Get plenty of rest.  Take medicines only as directed by your health care provider. Your health care provider may recommend over-the-counter medicines for pain.  Apply moist heat compresses to the site of swollen lymph nodes as directed by your health care provider. This can help reduce any pain.  Check your lymph nodes daily for any changes.  Keep all follow-up visits as directed by your health care provider. This is important. Contact a health care provider if:  Your lymph nodes are still swollen after 2 weeks.  Your swelling increases or spreads to other areas.  Your lymph nodes are hard, seem fixed to the skin, or are growing rapidly.  Your skin over the lymph  nodes is red and inflamed.  You have a fever.  You have chills.  You have fatigue.  You develop a sore throat.  You have abdominal pain.  You have weight loss.  You have night sweats. Get help right away if:  You notice fluid leaking from the area of the enlarged lymph node.  You have severe pain in any area of your body.  You have chest pain.  You have shortness of breath. This information is not intended to replace advice given to you by your health care provider. Make sure you discuss any questions you have with your health care provider. Document Released: 02/05/2008 Document Revised: 10/04/2015 Document Reviewed: 12/01/2013 Elsevier Interactive Patient Education  2017 [employer]   IF you received an x-ray today, you will receive an invoice from [facility]. Please contact [facility] at [phone] with questions or concerns regarding your invoice.   IF you received labwork today, you will receive an invoice from [facility]. Please contact [facility] at [phone] with questions or concerns regarding your invoice.   Our billing staff will not be able to assist you with questions regarding bills from these companies.  You will be contacted with the lab results as soon as they are available. The fastest way to get your results is to activate your My Chart account. Instructions are located on the last page of this paperwork. If you have not heard from us regarding the results in 2 weeks, please contact this office.

## 2016-07-16 NOTE — Progress Notes (Signed)
[facility] [address], [place] [address] [phone]  Date:  07/16/2016   Name:  Latoya Brooks   DOB:  Aug 02, 1975   MRN:  [id number]  PCP:  No PCP Per Patient    History of Present Illness:  Latoya Brooks is a 41 y.o. female patient who [facility] of swollen lymph node.    Patient reports that she has had cold and teeth pain and throat pain that started about 4 days ago.  The next day she had congestion and sneezing.  Today she has pain at the left side of her neck.  No sore throat.   No water eyes.  Order processor.  She has UR symptoms of her daughter.    Patient Active Problem List   Diagnosis Date Noted  . H. pylori infection 05/30/2015  . GERD (gastroesophageal reflux disease) 05/24/2015    Past Medical History:  Diagnosis Date  . Dysmenorrhea   . GERD (gastroesophageal reflux disease)    occasionally - med prn  . Pelvic inflammatory disease (PID)   . Preterm labor   . SVD (spontaneous vaginal delivery)    x 3    Past Surgical History:  Procedure Laterality Date  . CHOLECYSTECTOMY    . LAPAROSCOPIC TUBAL LIGATION Bilateral 07/08/2013   Procedure: LAPAROSCOPIC TUBAL LIGATION with Filshie clips;  Surgeon: Turner Danielsavid C Lowe, MD;  Location: [facility];  Service: Gynecology;  Laterality: Bilateral;  . WISDOM TOOTH EXTRACTION     general anesthesia    Social History  Substance Use Topics  . Smoking status: Current Some Day Smoker    Packs/day: 0.25    Years: 26.00    Types: Cigarettes  . Smokeless tobacco: Never Used  . Alcohol use 0.0 oz/week     Comment: occasional    No family history on file.  No Known Allergies  Medication list has been reviewed and updated.  Current Outpatient Prescriptions on File Prior to Visit  Medication Sig Dispense Refill  . pantoprazole (PROTONIX) 40 MG tablet Take 40 mg by mouth daily.     No current facility-administered medications on file prior to visit.     ROS ROS otherwise unremarkable  unless listed above.  Physical Examination: BP 129/79 (BP Location: Right Arm, Patient Position: Sitting, Cuff Size: Large)   Pulse 97   Temp 98.3 F (36.8 C) (Oral)   Resp 17   Ht 5' 6.5" (1.689 m)   Wt 245 lb (111.1 kg)   LMP 06/21/2016 (Approximate)   SpO2 97%   BMI 38.95 kg/m  Ideal Body Weight: Weight in (lb) to have BMI = 25: 156.9  Physical Exam  Constitutional: She is oriented to person, place, and time. She appears well-developed and well-nourished. No distress.  HENT:  Head: Normocephalic and atraumatic.  Right Ear: Tympanic membrane, external ear and ear canal normal.  Left Ear: Tympanic membrane, external ear and ear canal normal.  Nose: Mucosal edema and rhinorrhea present. Right sinus exhibits no maxillary sinus tenderness and no frontal sinus tenderness. Left sinus exhibits no maxillary sinus tenderness and no frontal sinus tenderness.  Mouth/Throat: No uvula swelling. No oropharyngeal exudate, posterior oropharyngeal edema or posterior oropharyngeal erythema.  Eyes: Conjunctivae and EOM are normal. Pupils are equal, round, and reactive to light.  Cardiovascular: Normal rate and regular rhythm.  Exam reveals no gallop, no distant heart sounds and no friction rub.   No murmur heard. Pulmonary/Chest: Effort normal. No respiratory distress. She has no  decreased breath sounds. She has no wheezes. She has no rhonchi.  Musculoskeletal:  Pain incited with cervical motion.  Lymphadenopathy:       Head (right side): No submandibular, no tonsillar, no preauricular and no posterior auricular adenopathy present.       Head (left side): Tonsillar adenopathy present. No submandibular, no preauricular and no posterior auricular adenopathy present.  Prominent lymph node with tenderness.  Neurological: She is alert and oriented to person, place, and time.  Skin: She is not diaphoretic.  Psychiatric: She has a normal mood and affect. Her behavior is normal.     Assessment and  Plan: Latoya Brooks is a 40 y.o. female who is here today for swollen lymph node. Offered blood work (cbc, hiv,ebv, sed).  She would decline this.  Will treat with abx, advised to return if sxs do not improve in 2 weeks.  Multiple respiratory illnesses recurrent and will cover for bacterial etiology. Advised quitting smoking Swollen lymph nodes - Plan: amoxicillin-clavulanate (AUGMENTIN) 875-125 MG tablet  Trena Platt, PA-C [facility] 3/8/20188:38 AM

## 2016-08-20 ENCOUNTER — Ambulatory Visit (INDEPENDENT_AMBULATORY_CARE_PROVIDER_SITE_OTHER): Payer: 59 | Admitting: Family Medicine

## 2016-08-20 VITALS — BP 132/81 | HR 78 | Temp 98.3°F | Resp 17 | Ht 66.5 in | Wt 241.0 lb

## 2016-08-20 DIAGNOSIS — R002 Palpitations: Secondary | ICD-10-CM | POA: Diagnosis not present

## 2016-08-20 LAB — POCT CBC
GRANULOCYTE PERCENT: 54.8 % (ref 37–80)
HEMATOCRIT: 39.1 % (ref 37.7–47.9)
Hemoglobin: 13.3 g/dL (ref 12.2–16.2)
Lymph, poc: 2.6 (ref 0.6–3.4)
MCH, POC: 27.4 pg (ref 27–31.2)
MCHC: 34 g/dL (ref 31.8–35.4)
MCV: 80.4 fL (ref 80–97)
MID (CBC): 0.3 (ref 0–0.9)
MPV: 7.4 fL (ref 0–99.8)
POC GRANULOCYTE: 3.5 (ref 2–6.9)
POC LYMPH %: 40.5 % (ref 10–50)
POC MID %: 4.7 % (ref 0–12)
Platelet Count, POC: 341 10*3/uL (ref 142–424)
RBC: 4.86 M/uL (ref 4.04–5.48)
RDW, POC: 15.3 %
WBC: 6.3 10*3/uL (ref 4.6–10.2)

## 2016-08-20 NOTE — Patient Instructions (Addendum)
Your EKG looks normal. Your hemoglobin was normal  Our office will call you with the other lab results.  You have been referred to cardiology to evaluate your palpitations more.  Try to cut back on smoking and caffeine, these can worsen symptoms.   Palpitations A palpitation is the feeling that your heartbeat is irregular or is faster than normal. It may feel like your heart is fluttering or skipping a beat. Palpitations are usually not a serious problem. They may be caused by many things, including smoking, caffeine, alcohol, stress, and certain medicines. Although most causes of palpitations are not serious, palpitations can be a sign of a serious medical problem. In some cases, you may need further medical evaluation. Follow these instructions at home: Pay attention to any changes in your symptoms. Take these actions to help with your condition:  Avoid the following:  Caffeinated coffee, tea, soft drinks, diet pills, and energy drinks.  Chocolate.  Alcohol.  Do not use any tobacco products, such as cigarettes, chewing tobacco, and e-cigarettes. If you need help quitting, ask your health care provider.  Try to reduce your stress and anxiety. Things that can help you relax include:  Yoga.  Meditation.  Physical activity, such as swimming, jogging, or walking.  Biofeedback. This is a method that helps you learn to use your mind to control things in your body, such as your heartbeats.  Get plenty of rest and sleep.  Take over-the-counter and prescription medicines only as told by your health care provider.  Keep all follow-up visits as told by your health care provider. This is important. Contact a health care provider if:  You continue to have a fast or irregular heartbeat after 24 hours.  Your palpitations occur more often. Get help right away if:  You have chest pain or shortness of breath.  You have a severe headache.  You feel dizzy or you faint. This  information is not intended to replace advice given to you by your health care provider. Make sure you discuss any questions you have with your health care provider. Document Released: 04/25/2000 Document Revised: 10/01/2015 Document Reviewed: 01/11/2015 Elsevier Interactive Patient Education  2017 [employer].     IF you received an x-ray today, you will receive an invoice from [facility]. Please contact [facility] at [phone] with questions or concerns regarding your invoice.   IF you received labwork today, you will receive an invoice from [facility]. Please contact [facility] at [phone] with questions or concerns regarding your invoice.   Our billing staff will not be able to assist you with questions regarding bills from these companies.  You will be contacted with the lab results as soon as they are available. The fastest way to get your results is to activate your My Chart account. Instructions are located on the last page of this paperwork. If you have not heard from [place] regarding the results in 2 weeks, please contact this office.

## 2016-08-20 NOTE — Progress Notes (Signed)
Latoya Brooks is a 41 y.o. female who presents to Primary Care at [facility] today for fluttering in her chest  1.  Fluttering in chest: patient hasn't felt like herself in the last 2 weeks. Fluttering occurs intermittently- it feels like she's just exerted herself even though she's at rest.  She feels SOB, but denies increased WOB. No pain associated. She tried to "burp" during these episodes to see if it would help, but she's been unable to.  These episodes last 2-10 minutes and resolve spontaneously. It is not as frequent this week compared to last week. Occurs a few times per day on average .  She has not been able to pinpoint an activity when these occur- not with eating, exertion, anxiety, stress, etc.  She noted a a feeling of being warm all over in addition to all the other symptoms which prompted her to go to [facility] on Saturday.  Reportedly they did an EKG which was normal. They suggested it could be reflux in nature. She's been taking Nexium.    Patient w/o a cardiac history. No HTN, DM, HLD. She does smoke 1pack every 3 days. No family h/o MI or arrhythmia.   ROS as above.  Pertinently, no chest pain, Fever, Chills, Abd pain, N/V/D.   PMH reviewed. Patient is a nonsmoker.   Past Medical History:  Diagnosis Date  . Dysmenorrhea   . GERD (gastroesophageal reflux disease)    occasionally - med prn  . Pelvic inflammatory disease (PID)   . Preterm labor   . SVD (spontaneous vaginal delivery)    x 3   Past Surgical History:  Procedure Laterality Date  . CHOLECYSTECTOMY    . LAPAROSCOPIC TUBAL LIGATION Bilateral 07/08/2013   Procedure: LAPAROSCOPIC TUBAL LIGATION with Filshie clips;  Surgeon: Turner Daniels, MD;  Location: [facility];  Service: Gynecology;  Laterality: Bilateral;  . WISDOM TOOTH EXTRACTION     general anesthesia    Medications reviewed. Current Outpatient Prescriptions  Medication Sig Dispense Refill  . pantoprazole (PROTONIX) 40 MG tablet Take 40 mg by mouth daily.      No current facility-administered medications for this visit.      Physical Exam:  BP 132/81 (BP Location: Right Arm, Patient Position: Sitting, Cuff Size: Large)   Pulse 78   Temp 98.3 F (36.8 C) (Oral)   Resp 17   Ht 5' 6.5" (1.689 m)   Wt 241 lb (109.3 kg)   SpO2 98%   BMI 38.32 kg/m  Gen:  Alert, cooperative patient who appears stated age in no acute distress.  Vital signs reviewed. HEENT: EOMI,  MMM Neck: no LAD. No thyromegaly. Pulm:  Clear to auscultation bilaterally with good air movement.  No wheezes or rales noted.   Cardiac:  Regular rate and rhythm without murmur auscultated.  Good S1/S2. Abd:  Soft/nondistended/nontender.  Good bowel sounds throughout all four quadrants.  No masses noted.  Exts: Non edematous BL  LE, warm and well perfused.   EKG: NSR, HR 73. 1 PVC. No ST elevation or depression. QTc 384  CBC    Component Value Date/Time   WBC 6.3 08/20/2016 1729   WBC 6.6 02/26/2016 1026   RBC 4.86 08/20/2016 1729   RBC 4.86 02/26/2016 1026   HGB 13.3 08/20/2016 1729   HGB 13.1 02/26/2016 1026   HCT 39.1 08/20/2016 1729   HCT 38.2 02/26/2016 1026   PLT 336 02/26/2016 1026   MCV 80.4 08/20/2016 1729   MCH 27.4 08/20/2016 1729  MCH 27.0 02/26/2016 1026   MCHC 34.0 08/20/2016 1729   MCHC 34.3 02/26/2016 1026   RDW 14.9 02/26/2016 1026   LYMPHSABS 2.1 02/24/2015 2028   MONOABS 0.7 02/24/2015 2028   EOSABS 0.1 02/24/2015 2028   BASOSABS 0.0 02/24/2015 2028     Assessment and Plan:  1.  41 y/o with a PMHx of GERD presenting for daily episodes of palpitations and feelings of SOB over the last 2 weeks. EKD re-assuring. Regular rate and rhythm on my exam. CBC revealed a hemoglobin of 13.3 - TSH and CMET - decrease nicotine, avoid caffeine.  - referral to cardiology for holter monitor - return precautions discussed.  Joanna Puff, MD [facility] Resident  08/20/2016, 5:28 PM

## 2016-08-21 LAB — COMPREHENSIVE METABOLIC PANEL
A/G RATIO: 1.5 (ref 1.2–2.2)
ALK PHOS: 65 IU/L (ref 39–117)
ALT: 13 IU/L (ref 0–32)
AST: 15 IU/L (ref 0–40)
Albumin: 3.9 g/dL (ref 3.5–5.5)
BILIRUBIN TOTAL: 0.3 mg/dL (ref 0.0–1.2)
BUN/Creatinine Ratio: 12 (ref 9–23)
BUN: 8 mg/dL (ref 6–24)
CALCIUM: 9 mg/dL (ref 8.7–10.2)
CHLORIDE: 104 mmol/L (ref 96–106)
CO2: 25 mmol/L (ref 18–29)
Creatinine, Ser: 0.69 mg/dL (ref 0.57–1.00)
GFR calc Af Amer: 124 mL/min/{1.73_m2} (ref >59)
GFR calc non Af Amer: 108 mL/min/{1.73_m2} (ref >59)
Globulin, Total: 2.6 g/dL (ref 1.5–4.5)
Glucose: 95 mg/dL (ref 65–99)
POTASSIUM: 4 mmol/L (ref 3.5–5.2)
SODIUM: 142 mmol/L (ref 134–144)
Total Protein: 6.5 g/dL (ref 6.0–8.5)

## 2016-08-21 LAB — TSH: TSH: 1.03 u[IU]/mL (ref 0.450–4.500)

## 2016-08-22 ENCOUNTER — Encounter (HOSPITAL_COMMUNITY): Payer: Self-pay | Admitting: Emergency Medicine

## 2016-08-22 ENCOUNTER — Emergency Department (HOSPITAL_COMMUNITY): Payer: 59

## 2016-08-22 DIAGNOSIS — F17210 Nicotine dependence, cigarettes, uncomplicated: Secondary | ICD-10-CM | POA: Diagnosis not present

## 2016-08-22 DIAGNOSIS — R0789 Other chest pain: Secondary | ICD-10-CM | POA: Insufficient documentation

## 2016-08-22 DIAGNOSIS — K219 Gastro-esophageal reflux disease without esophagitis: Secondary | ICD-10-CM | POA: Insufficient documentation

## 2016-08-22 DIAGNOSIS — Z79899 Other long term (current) drug therapy: Secondary | ICD-10-CM | POA: Insufficient documentation

## 2016-08-22 DIAGNOSIS — R109 Unspecified abdominal pain: Secondary | ICD-10-CM | POA: Diagnosis present

## 2016-08-22 LAB — CBC
HCT: 38.5 % (ref 36.0–46.0)
HEMOGLOBIN: 12.7 g/dL (ref 12.0–15.0)
MCH: 26.6 pg (ref 26.0–34.0)
MCHC: 33 g/dL (ref 30.0–36.0)
MCV: 80.7 fL (ref 78.0–100.0)
Platelets: 324 10*3/uL (ref 150–400)
RBC: 4.77 MIL/uL (ref 3.87–5.11)
RDW: 14.7 % (ref 11.5–15.5)
WBC: 5.7 10*3/uL (ref 4.0–10.5)

## 2016-08-22 LAB — BASIC METABOLIC PANEL
ANION GAP: 11 (ref 5–15)
BUN: 5 mg/dL — ABNORMAL LOW (ref 6–20)
CALCIUM: 9.7 mg/dL (ref 8.9–10.3)
CO2: 23 mmol/L (ref 22–32)
Chloride: 107 mmol/L (ref 101–111)
Creatinine, Ser: 0.68 mg/dL (ref 0.44–1.00)
GFR calc Af Amer: >60 mL/min (ref >60)
Glucose, Bld: 101 mg/dL — ABNORMAL HIGH (ref 65–99)
POTASSIUM: 3.4 mmol/L — AB (ref 3.5–5.1)
SODIUM: 141 mmol/L (ref 135–145)

## 2016-08-22 LAB — I-STAT TROPONIN, ED: TROPONIN I, POC: 0 ng/mL (ref 0.00–0.08)

## 2016-08-22 NOTE — ED Triage Notes (Signed)
Patient with chest pain and shortness of breath.  She states she has been having this for the last two weeks, has been seen for this twice in the last few weeks.  She states that she felt better after belching, but continues with the pain.  She states that she feels her heart race at times.  She does have some shortness of breath with the pain.

## 2016-08-23 ENCOUNTER — Emergency Department (HOSPITAL_COMMUNITY)
Admission: EM | Admit: 2016-08-23 | Discharge: 2016-08-23 | Disposition: A | Discharge status: Home / Self Care | Payer: 59 | Attending: Emergency Medicine | Admitting: Emergency Medicine

## 2016-08-23 ENCOUNTER — Encounter (HOSPITAL_BASED_OUTPATIENT_CLINIC_OR_DEPARTMENT_OTHER): Payer: Self-pay | Admitting: Emergency Medicine

## 2016-08-23 ENCOUNTER — Emergency Department (HOSPITAL_BASED_OUTPATIENT_CLINIC_OR_DEPARTMENT_OTHER)
Admission: EM | Admit: 2016-08-23 | Discharge: 2016-08-23 | Disposition: A | Discharge status: Home / Self Care | Payer: 59 | Source: Home / Self Care | Attending: Emergency Medicine | Admitting: Emergency Medicine

## 2016-08-23 DIAGNOSIS — K219 Gastro-esophageal reflux disease without esophagitis: Secondary | ICD-10-CM

## 2016-08-23 DIAGNOSIS — Z79899 Other long term (current) drug therapy: Secondary | ICD-10-CM | POA: Insufficient documentation

## 2016-08-23 DIAGNOSIS — R0789 Other chest pain: Secondary | ICD-10-CM

## 2016-08-23 DIAGNOSIS — F17210 Nicotine dependence, cigarettes, uncomplicated: Secondary | ICD-10-CM | POA: Insufficient documentation

## 2016-08-23 LAB — CBC
HEMATOCRIT: 37.7 % (ref 36.0–46.0)
Hemoglobin: 12.7 g/dL (ref 12.0–15.0)
MCH: 27 pg (ref 26.0–34.0)
MCHC: 33.7 g/dL (ref 30.0–36.0)
MCV: 80.2 fL (ref 78.0–100.0)
Platelets: 325 10*3/uL (ref 150–400)
RBC: 4.7 MIL/uL (ref 3.87–5.11)
RDW: 14.9 % (ref 11.5–15.5)
WBC: 5.4 10*3/uL (ref 4.0–10.5)

## 2016-08-23 LAB — HEPATIC FUNCTION PANEL
ALBUMIN: 3.5 g/dL (ref 3.5–5.0)
ALT: 20 U/L (ref 14–54)
AST: 21 U/L (ref 15–41)
Alkaline Phosphatase: 53 U/L (ref 38–126)
BILIRUBIN TOTAL: 0.5 mg/dL (ref 0.3–1.2)
Bilirubin, Direct: <0.1 mg/dL — ABNORMAL LOW (ref 0.1–0.5)
TOTAL PROTEIN: 6.5 g/dL (ref 6.5–8.1)

## 2016-08-23 LAB — COMPREHENSIVE METABOLIC PANEL
ALT: 22 U/L (ref 14–54)
ANION GAP: 7 (ref 5–15)
AST: 19 U/L (ref 15–41)
Albumin: 3.7 g/dL (ref 3.5–5.0)
Alkaline Phosphatase: 54 U/L (ref 38–126)
BILIRUBIN TOTAL: 0.5 mg/dL (ref 0.3–1.2)
BUN: 7 mg/dL (ref 6–20)
CO2: 27 mmol/L (ref 22–32)
Calcium: 9.1 mg/dL (ref 8.9–10.3)
Chloride: 106 mmol/L (ref 101–111)
Creatinine, Ser: 0.63 mg/dL (ref 0.44–1.00)
GFR calc Af Amer: >60 mL/min (ref >60)
Glucose, Bld: 90 mg/dL (ref 65–99)
POTASSIUM: 3.9 mmol/L (ref 3.5–5.1)
Sodium: 140 mmol/L (ref 135–145)
TOTAL PROTEIN: 6.9 g/dL (ref 6.5–8.1)

## 2016-08-23 LAB — TROPONIN I

## 2016-08-23 LAB — I-STAT TROPONIN, ED: Troponin i, poc: 0 ng/mL (ref 0.00–0.08)

## 2016-08-23 LAB — D-DIMER, QUANTITATIVE (NOT AT ARMC)

## 2016-08-23 LAB — LIPASE, BLOOD: LIPASE: 17 U/L (ref 11–51)

## 2016-08-23 MED ORDER — FAMOTIDINE 20 MG PO TABS: 20.0000 mg | ORAL_TABLET | Freq: Two times a day (BID) | ORAL | 0 refills | Status: DC

## 2016-08-23 MED ORDER — ALUM & MAG HYDROXIDE-SIMETH 200-200-20 MG/5ML PO SUSP: 15.0000 mL | Freq: Four times a day (QID) | ORAL | 0 refills | Status: DC | PRN

## 2016-08-23 NOTE — ED Notes (Signed)
Pt refused wheelchair and ambulated to room without assistance, in NAD.

## 2016-08-23 NOTE — ED Notes (Signed)
Patient claimed heaviness on her heart and felt like its raising.  HR is 58.

## 2016-08-23 NOTE — Discharge Instructions (Signed)
Take the medications you were prescribed previously, you can take the carafate that you had from before as well.  Follow up with a primary care doctor, consider seeing a GI doctor

## 2016-08-23 NOTE — ED Provider Notes (Signed)
[facility]-EMERGENCY DEPT Provider Note   CSN: [id number] Arrival date & time: 08/22/16  2153   By signing my name below, I, Latoya Brooks, attest that this documentation has been prepared under the direction and in the presence of Pricilla Loveless, MD. Electronically Signed: Soijett Brooks, ED Scribe. 08/23/16. 1:19 AM.  History   Chief Complaint Chief Complaint  Patient presents with  . Chest Pain  . Shortness of Breath    HPI Latoya Brooks is a 41 y.o. female with a PMHx of GERD, who presents to the Emergency Department complaining of intermittent, sharp, left upper, CP onset 2 weeks ago. Pt reports associated SOB only with CP episodes, subjective fever, abdominal cramping, palpitations x 2 weeks, and fatigue. Pt has tried TUMS with mild relief of her symptoms. She states that her abdominal cramping and chest pain has resolved while in the ED. Pt states that she consumed a sub with peppers from [employer] at 1 PM yesterday prior to the onset of her symptoms. She notes that she typically doesn't have issues with her GERD as long as she takes her Protonix and doesn't consume onions. Pt reports that she was evaluated at Urgent Care last week and 3 days ago for her symptoms with a negative workup both times. She denies any other symptoms. Pt notes that she does smoke cigarettes, but denies PMHx of HTN, DM, high cholesterol, or family hx of MI. Denies having a PCP at this time, but states that she has an appointment to acquire a PCP on 08/27/2016.      The history is provided by the patient. No language interpreter was used.    Past Medical History:  Diagnosis Date  . Dysmenorrhea   . GERD (gastroesophageal reflux disease)    occasionally - med prn  . Pelvic inflammatory disease (PID)   . Preterm labor   . SVD (spontaneous vaginal delivery)    x 3    Patient Active Problem List   Diagnosis Date Noted  . H. pylori infection 05/30/2015  . GERD (gastroesophageal reflux disease) 05/24/2015     Past Surgical History:  Procedure Laterality Date  . CHOLECYSTECTOMY    . LAPAROSCOPIC TUBAL LIGATION Bilateral 07/08/2013   Procedure: LAPAROSCOPIC TUBAL LIGATION with Filshie clips;  Surgeon: Turner Daniels, MD;  Location: [facility];  Service: Gynecology;  Laterality: Bilateral;  . WISDOM TOOTH EXTRACTION     general anesthesia    OB History    Gravida Para Term Preterm AB Living   SAB TAB Ectopic Multiple Live Births   0 2 0 0 3       Home Medications    Prior to Admission medications   Medication Sig Start Date End Date Taking? Authorizing Provider  alum & mag hydroxide-simeth (MAALOX/MYLANTA) 200-200-20 MG/5ML suspension Take 15 mLs by mouth every 6 (six) hours as needed for indigestion or heartburn. 08/23/16   Pricilla Loveless, MD  famotidine (PEPCID) 20 MG tablet Take 1 tablet (20 mg total) by mouth 2 (two) times daily. 08/23/16   Pricilla Loveless, MD  pantoprazole (PROTONIX) 40 MG tablet Take 40 mg by mouth daily.    Historical Provider, MD    Family History History reviewed. No pertinent family history.  Social History Social History  Substance Use Topics  . Smoking status: Current Some Day Smoker    Packs/day: 0.25    Years: 26.00    Types: Cigarettes  . Smokeless tobacco: Never Used  . Alcohol  use 0.0 oz/week     Comment: occasional     Allergies   Patient has no known allergies.   Review of Systems Review of Systems  Constitutional: Positive for fatigue and fever (subjective).  Respiratory: Positive for shortness of breath.   Cardiovascular: Positive for chest pain and palpitations.  Gastrointestinal: Positive for abdominal pain (cramping).  All other systems reviewed and are negative.    Physical Exam Updated Vital Signs BP 105/71   Pulse 64   Temp 97.9 F (36.6 C) (Oral)   Resp 17   LMP 08/17/2016 (Exact Date)   SpO2 96%   Physical Exam  Constitutional: She is oriented to person, place, and time. She appears well-developed and  well-nourished.  Morbidly obese.   HENT:  Head: Normocephalic and atraumatic.  Right Ear: External ear normal.  Left Ear: External ear normal.  Nose: Nose normal.  Eyes: Right eye exhibits no discharge. Left eye exhibits no discharge.  Cardiovascular: Normal rate, regular rhythm and normal heart sounds.   Pulmonary/Chest: Effort normal and breath sounds normal.  Abdominal: Soft. There is no tenderness.  Neurological: She is alert and oriented to person, place, and time.  Skin: Skin is warm and dry.  Nursing note and vitals reviewed.    ED Treatments / Results  DIAGNOSTIC STUDIES: Oxygen Saturation is 99% on RA, nl by my interpretation.    COORDINATION OF CARE: 1:16 AM Discussed treatment plan with pt at bedside which includes labs, EKG, CXR, and pt agreed to plan.   aLabs (all labs ordered are listed, but only abnormal results are displayed) Labs Reviewed  BASIC METABOLIC PANEL - Abnormal; Notable for the following:       Result Value   Potassium 3.4 (*)    Glucose, Bld 101 (*)    BUN 5 (*)    All other components within normal limits  HEPATIC FUNCTION PANEL - Abnormal; Notable for the following:    Bilirubin, Direct <0.1 (*)    All other components within normal limits  CBC  LIPASE, BLOOD  I-STAT TROPOININ, ED  I-STAT TROPOININ, ED    EKG  EKG Interpretation  Date/Time:  Friday August 22 2016 22:08:30 EDT Ventricular Rate:  63 PR Interval:  142 QRS Duration: 94 QT Interval:  402 QTC Calculation: 411 R Axis:   61 Text Interpretation:  Normal sinus rhythm Normal ECG no significant change since Jan 2016 Confirmed by Criss Alvine MD, Gardy Montanari [phone]) on 08/23/2016 12:51:27 AM       EKG Interpretation  Date/Time:  Saturday August 23 2016 02:04:17 EDT Ventricular Rate:  62 PR Interval:  142 QRS Duration: 105 QT Interval:  396 QTC Calculation: 403 R Axis:   46 Text Interpretation:  Sinus rhythm RSR' in V1 or V2, right VCD or RVH no acute ST/T changes no significant  change since yesterday Confirmed by Jandel Patriarca MD, Haunani Dickard [phone]) on 08/23/2016 3:49:10 AM        Radiology Dg Chest 2 View  Result Date: 08/22/2016 CLINICAL DATA:  Subacute onset of generalized chest pain and shortness of breath. Initial encounter. EXAM: CHEST  2 VIEW COMPARISON:  Chest radiograph performed 04/24/2007 FINDINGS: The lungs are well-aerated and clear. There is no evidence of focal opacification, pleural effusion or pneumothorax. The heart is normal in size; the mediastinal contour is within normal limits. No acute osseous abnormalities are seen. Clips are noted within the right upper quadrant, reflecting prior cholecystectomy. IMPRESSION: No acute cardiopulmonary process seen. Electronically Signed   By: Roanna Raider  M.D.   On: 08/22/2016 22:47    Procedures Procedures (including critical care time)  Medications Ordered in ED Medications - No data to display   Initial Impression / Assessment and Plan / ED Course  I have reviewed the triage vital signs and the nursing notes.  Pertinent labs & imaging results that were available during my care of the patient were reviewed by me and considered in my medical decision making (see chart for details).     Patient symptoms are most likely coming from GERD. Currently she is asymptomatic. I have low suspicion for ACS, PE, or dissection. She is already on a PPI, will add Maalox and Pepcid. Discussed avoiding certain foods. She already has a PCP appointment next week for setting up primary care. This point she appears stable for discharge and I have discussed strict return precautions.  Final Clinical Impressions(s) / ED Diagnoses   Final diagnoses:  Atypical chest pain  Gastroesophageal reflux disease, esophagitis presence not specified    New Prescriptions New Prescriptions   ALUM & MAG HYDROXIDE-SIMETH (MAALOX/MYLANTA) 200-200-20 MG/5ML SUSPENSION    Take 15 mLs by mouth every 6 (six) hours as needed for indigestion or  heartburn.   FAMOTIDINE (PEPCID) 20 MG TABLET    Take 1 tablet (20 mg total) by mouth 2 (two) times daily.   I personally performed the services described in this documentation, which was scribed in my presence. The recorded information has been reviewed and is accurate.     Pricilla Loveless, MD 08/23/16 [phone]

## 2016-08-23 NOTE — ED Provider Notes (Signed)
[facility] Provider [facility]   CSN: [id number] Arrival date & time: 08/23/16  1139  By signing my name below, I, Latoya Brooks, attest that this documentation has been prepared under the direction and in the presence of Linwood Dibbles, MD. Electronically Signed: Modena Brooks, Scribe. 08/23/2016. 3:02 PM.  History   Chief Complaint Chief Complaint  Patient presents with  . Dizziness   The history is provided by the patient. No language interpreter was used.   HPI Comments: Latoya Brooks is a 41 y.o. female who presents to the Emergency Department complaining of intermittent central chest pain that started about 4 hours ago. She was seen yesterday for a similar episode. Her episodes have been going on intermittently for the past few weeks. Her pain started after eating cream of chicken soup, which was an exacerbating factor yesterday also. Her pain is non-radiating. She reports associated SOB. She has a PCP appointment in 4 days. She admits to a hx of cholecystectomy. Denies any pertinent cardiac hx, hx of DVT/PE, leg swelling, or other complaints at this time.    PCP: Trena Platt, PA  Past Medical History:  Diagnosis Date  . Dysmenorrhea   . GERD (gastroesophageal reflux disease)    occasionally - med prn  . Pelvic inflammatory disease (PID)   . Preterm labor   . SVD (spontaneous vaginal delivery)    x 3    Patient Active Problem List   Diagnosis Date Noted  . H. pylori infection 05/30/2015  . GERD (gastroesophageal reflux disease) 05/24/2015    Past Surgical History:  Procedure Laterality Date  . CHOLECYSTECTOMY    . LAPAROSCOPIC TUBAL LIGATION Bilateral 07/08/2013   Procedure: LAPAROSCOPIC TUBAL LIGATION with Filshie clips;  Surgeon: Turner Daniels, MD;  Location: [facility];  Service: Gynecology;  Laterality: Bilateral;  . WISDOM TOOTH EXTRACTION     general anesthesia    OB History    Gravida Para Term Preterm AB Living   SAB TAB Ectopic  Multiple Live Births   0 2 0 0 3       Home Medications    Prior to Admission medications   Medication Sig Start Date End Date Taking? Authorizing Provider  alum & mag hydroxide-simeth (MAALOX/MYLANTA) 200-200-20 MG/5ML suspension Take 15 mLs by mouth every 6 (six) hours as needed for indigestion or heartburn. 08/23/16   Pricilla Loveless, MD  famotidine (PEPCID) 20 MG tablet Take 1 tablet (20 mg total) by mouth 2 (two) times daily. 08/23/16   Pricilla Loveless, MD  pantoprazole (PROTONIX) 40 MG tablet Take 40 mg by mouth daily.    Historical Provider, MD    Family History No family history on file.  Social History Social History  Substance Use Topics  . Smoking status: Current Some Day Smoker    Packs/day: 0.25    Years: 26.00    Types: Cigarettes  . Smokeless tobacco: Never Used  . Alcohol use 0.0 oz/week     Comment: occasional     Allergies   Patient has no known allergies.   Review of Systems Review of Systems  Cardiovascular: Positive for chest pain (Central). Negative for leg swelling.  All other systems reviewed and are negative.   Physical Exam Updated Vital Signs BP 116/65   Pulse 62   Temp 98.6 F (37 C) (Oral)   Resp 17   Wt 241 lb (109.3 kg)   LMP 08/17/2016 (Exact Date)   SpO2 100%  BMI 38.32 kg/m   Physical Exam  Constitutional: She appears well-developed and well-nourished. No distress.  HENT:  Head: Normocephalic and atraumatic.  Right Ear: External ear normal.  Left Ear: External ear normal.  Eyes: Conjunctivae are normal. Right eye exhibits no discharge. Left eye exhibits no discharge. No scleral icterus.  Neck: Neck supple. No tracheal deviation present.  Cardiovascular: Normal rate, regular rhythm and intact distal pulses.   Pulmonary/Chest: Effort normal and breath sounds normal. No stridor. No respiratory distress. She has no wheezes. She has no rales.  Abdominal: Soft. Bowel sounds are normal. She exhibits no distension. There is no  tenderness. There is no rebound and no guarding.  Musculoskeletal: She exhibits no edema or tenderness.  Neurological: She is alert. She has normal strength. No cranial nerve deficit (no facial droop, extraocular movements intact, no slurred speech) or sensory deficit. She exhibits normal muscle tone. She displays no seizure activity. Coordination normal.  Skin: Skin is warm and dry. No rash noted.  Psychiatric: She has a normal mood and affect.  Nursing note and vitals reviewed.    ED Treatments / Results  DIAGNOSTIC STUDIES: Oxygen Saturation is 100% on RA, normal by my interpretation.    COORDINATION OF CARE: 3:06 PM- Pt advised of plan for treatment and pt agrees.  Labs (all labs ordered are listed, but only abnormal results are displayed) Labs Reviewed  CBC  COMPREHENSIVE METABOLIC PANEL  D-DIMER, QUANTITATIVE (NOT AT [facility])  TROPONIN I    EKG  EKG Interpretation  Date/Time:  Saturday August 23 2016 11:59:46 EDT Ventricular Rate:  58 PR Interval:  144 QRS Duration: 92 QT Interval:  388 QTC Calculation: 380 R Axis:   61 Text Interpretation:  Sinus bradycardia Otherwise normal ECG No significant change since last tracing Confirmed by Kamsiyochukwu Buist  MD-J, Rayvon Brandvold ([id number]) on 08/23/2016 3:10:21 PM       Radiology Dg Chest 2 View  Result Date: 08/22/2016 CLINICAL DATA:  Subacute onset of generalized chest pain and shortness of breath. Initial encounter. EXAM: CHEST  2 VIEW COMPARISON:  Chest radiograph performed 04/24/2007 FINDINGS: The lungs are well-aerated and clear. There is no evidence of focal opacification, pleural effusion or pneumothorax. The heart is normal in size; the mediastinal contour is within normal limits. No acute osseous abnormalities are seen. Clips are noted within the right upper quadrant, reflecting prior cholecystectomy. IMPRESSION: No acute cardiopulmonary process seen. Electronically Signed   By: Roanna Raider M.D.   On: 08/22/2016 22:47     Procedures Procedures (including critical care time)  Medications Ordered in ED Medications - No data to display   Initial Impression / Assessment and Plan / ED Course  I have reviewed the triage vital signs and the nursing notes.  Pertinent labs & imaging results that were available during my care of the patient were reviewed by me and considered in my medical decision making (see chart for details).   patient presented to the emergency room for recurrent episodes of chest pain. She's had a few episodes recently. She was x-ray seen in the emergency room early this morning for similar symptoms. At that time she had 2 sets of cardiac enzymes were negative.  Patient presented after having recurrent episode after eating something and smoking cigarettes.  Patient's only cardiac risk factor is tobacco use. Her symptoms are atypical. I doubt that her symptoms today are related to acute coronary syndrome.  Based on the relationship of these symptoms with eating a think this could be related  to gastroesophageal reflux.  She was given prescriptions for Maalox and Pepcid. She has not been able take yet. In the past she was diagnosed with reflux and has a bottle of Carafate at home. I instructed she can also take that as well. I recommend follow-up with a primary care doctor. I did give her the name of a GI doctor she can consider seeing as well  Final Clinical Impressions(s) / ED Diagnoses   Final diagnoses:  Gastroesophageal reflux disease, esophagitis presence not specified    New Prescriptions New Prescriptions   No medications on file    I personally performed the services described in this documentation, which was scribed in my presence.  The recorded information has been reviewed and is accurate.    Linwood Dibbles, MD 08/23/16 [phone]

## 2016-08-23 NOTE — ED Triage Notes (Signed)
Pt reports she had dizziness after she ate breakfast and smoked a cigarette. Dizziness lasted an hour and subsided. Pt was seen yesterday for chest pain and dx with reflux. Pt reports feeling flushed now. Denies SOB

## 2016-08-23 NOTE — ED Notes (Signed)
Pt denys dizziness during orthostatics. c/o mild lightheadedness going from laying to sitting.

## 2016-08-24 DIAGNOSIS — R0600 Dyspnea, unspecified: Secondary | ICD-10-CM | POA: Insufficient documentation

## 2016-08-24 DIAGNOSIS — R079 Chest pain, unspecified: Secondary | ICD-10-CM | POA: Insufficient documentation

## 2016-08-24 NOTE — Progress Notes (Deleted)
Cardiology Office Note    Date:  08/24/2016   ID:  Latoya Brooks, DOB 10/09/1975, MRN [id number]  PCP:  Trena Platt, PA  Cardiologist: Lesleigh Noe, MD   No chief complaint on file.   History of Present Illness:  Latoya Brooks is a 41 y.o. female  Evaluation of recurrent CP and dyspnea.    Past Medical History:  Diagnosis Date  . Dysmenorrhea   . GERD (gastroesophageal reflux disease)    occasionally - med prn  . Pelvic inflammatory disease (PID)   . Preterm labor   . SVD (spontaneous vaginal delivery)    x 3    Past Surgical History:  Procedure Laterality Date  . CHOLECYSTECTOMY    . LAPAROSCOPIC TUBAL LIGATION Bilateral 07/08/2013   Procedure: LAPAROSCOPIC TUBAL LIGATION with Filshie clips;  Surgeon: Turner Daniels, MD;  Location: [facility];  Service: Gynecology;  Laterality: Bilateral;  . WISDOM TOOTH EXTRACTION     general anesthesia    Current Medications: Outpatient Medications Prior to Visit  Medication Sig Dispense Refill  . alum & mag hydroxide-simeth (MAALOX/MYLANTA) 200-200-20 MG/5ML suspension Take 15 mLs by mouth every 6 (six) hours as needed for indigestion or heartburn. 355 mL 0  . famotidine (PEPCID) 20 MG tablet Take 1 tablet (20 mg total) by mouth 2 (two) times daily. 10 tablet 0  . pantoprazole (PROTONIX) 40 MG tablet Take 40 mg by mouth daily.     No facility-administered medications prior to visit.      Allergies:   Patient has no known allergies.   Social History   Social History  . Marital status: Single    Spouse name: N/A  . Number of children: N/A  . Years of education: N/A   Social History Main Topics  . Smoking status: Current Some Day Smoker    Packs/day: 0.25    Years: 26.00    Types: Cigarettes  . Smokeless tobacco: Never Used  . Alcohol use 0.0 oz/week     Comment: occasional  . Drug use: No  . Sexual activity: Yes    Birth control/ protection: Surgical   Other Topics Concern  . Not on file   Social History  Narrative  . No narrative on file     Family History:  The patient's ***family history is not on file.   ROS:   Please see the history of present illness.    ***  All other systems reviewed and are negative.   PHYSICAL EXAM:   VS:  LMP 08/17/2016 (Exact Date)    GEN: Well nourished, well developed, in no acute distress  HEENT: normal  Neck: no JVD, carotid bruits, or masses Cardiac: ***RRR; no murmurs, rubs, or gallops,no edema  Respiratory:  clear to auscultation bilaterally, normal work of breathing GI: soft, nontender, nondistended, + BS MS: no deformity or atrophy  Skin: warm and dry, no rash Neuro:  Alert and Oriented x 3, Strength and sensation are intact Psych: euthymic mood, full affect  Wt Readings from Last 3 Encounters:  08/23/16 241 lb (109.3 kg)  08/20/16 241 lb (109.3 kg)  07/16/16 245 lb (111.1 kg)      Studies/Labs Reviewed:   EKG:  EKG  ***  Recent Labs: 08/20/2016: TSH 1.030 08/23/2016: ALT 22; BUN 7; Creatinine, Ser 0.63; Hemoglobin 12.7; Platelets 325; Potassium 3.9; Sodium 140   Lipid Panel No results found for: CHOL, TRIG, HDL, CHOLHDL, VLDL, LDLCALC, LDLDIRECT  Additional studies/ records that were reviewed today include:  ***  ASSESSMENT:    1. Other chest pain   2. Dyspnea, unspecified type      PLAN:  In order of problems listed above:  1. ***    Medication Adjustments/Labs and Tests Ordered: Current medicines are reviewed at length with the patient today.  Concerns regarding medicines are outlined above.  Medication changes, Labs and Tests ordered today are listed in the Patient Instructions below. There are no Patient Instructions on file for this visit.   Signed, Lesleigh Noe, MD  08/24/2016 1:49 PM    [facility] [address] [place], [place], [place]  [address] Phone: [phone]; Fax: [phone]

## 2016-08-25 ENCOUNTER — Ambulatory Visit: Payer: 59 | Admitting: Interventional Cardiology

## 2016-08-25 ENCOUNTER — Ambulatory Visit (INDEPENDENT_AMBULATORY_CARE_PROVIDER_SITE_OTHER): Payer: 59 | Admitting: Physician Assistant

## 2016-08-25 VITALS — BP 134/84 | HR 64 | Temp 98.7°F | Resp 17 | Ht 66.5 in | Wt 237.0 lb

## 2016-08-25 DIAGNOSIS — R300 Dysuria: Secondary | ICD-10-CM | POA: Diagnosis not present

## 2016-08-25 DIAGNOSIS — K219 Gastro-esophageal reflux disease without esophagitis: Secondary | ICD-10-CM | POA: Diagnosis not present

## 2016-08-25 DIAGNOSIS — B9689 Other specified bacterial agents as the cause of diseases classified elsewhere: Secondary | ICD-10-CM

## 2016-08-25 DIAGNOSIS — N760 Acute vaginitis: Secondary | ICD-10-CM | POA: Diagnosis not present

## 2016-08-25 DIAGNOSIS — R0789 Other chest pain: Secondary | ICD-10-CM | POA: Diagnosis not present

## 2016-08-25 LAB — POCT WET + KOH PREP
TRICH BY WET PREP: ABSENT
Yeast by KOH: ABSENT
Yeast by wet prep: ABSENT

## 2016-08-25 LAB — POCT URINALYSIS DIP (MANUAL ENTRY)
BILIRUBIN UA: NEGATIVE
GLUCOSE UA: NEGATIVE mg/dL
Leukocytes, UA: NEGATIVE
NITRITE UA: NEGATIVE
Protein Ur, POC: NEGATIVE mg/dL
RBC UA: NEGATIVE
SPEC GRAV UA: 1.02 (ref 1.010–1.025)
UROBILINOGEN UA: 0.2 U/dL
pH, UA: 5.5 (ref 5.0–8.0)

## 2016-08-25 LAB — POC MICROSCOPIC URINALYSIS (UMFC): MUCUS RE: ABSENT

## 2016-08-25 MED ORDER — FLUCONAZOLE 150 MG PO TABS: 150.0000 mg | ORAL_TABLET | Freq: Once | ORAL | 0 refills | Status: AC

## 2016-08-25 NOTE — Progress Notes (Deleted)
PRIMARY CARE AT [facility] [address] [place] [address] [phone]  Date:  08/25/2016   Name:  Latoya Passarella   DOB:  [birth year]   MRN:  [id number]  PCP:  Trena Platt, PA    History of Present Illness:  Latoya Brooks is a 41 y.o. female patient who presents to PCP with  Chief Complaint  Patient presents with  . Hospitalization Follow-up  . Dysuria    onset this am       Patient Active Problem List   Diagnosis Date Noted  . Chest pain 08/24/2016  . Dyspnea 08/24/2016  . H. pylori infection 05/30/2015  . GERD (gastroesophageal reflux disease) 05/24/2015    Past Medical History:  Diagnosis Date  . Dysmenorrhea   . GERD (gastroesophageal reflux disease)    occasionally - med prn  . Pelvic inflammatory disease (PID)   . Preterm labor   . SVD (spontaneous vaginal delivery)    x 3    Past Surgical History:  Procedure Laterality Date  . CHOLECYSTECTOMY    . LAPAROSCOPIC TUBAL LIGATION Bilateral 07/08/2013   Procedure: LAPAROSCOPIC TUBAL LIGATION with Filshie clips;  Surgeon: Turner Daniels, MD;  Location: [facility];  Service: Gynecology;  Laterality: Bilateral;  . WISDOM TOOTH EXTRACTION     general anesthesia    Social History  Substance Use Topics  . Smoking status: Current Some Day Smoker    Packs/day: 0.25    Years: 26.00    Types: Cigarettes  . Smokeless tobacco: Never Used  . Alcohol use 0.0 oz/week     Comment: occasional    No family history on file.  No Known Allergies  Medication list has been reviewed and updated.  Current Outpatient Prescriptions on File Prior to Visit  Medication Sig Dispense Refill  . alum & mag hydroxide-simeth (MAALOX/MYLANTA) 200-200-20 MG/5ML suspension Take 15 mLs by mouth every 6 (six) hours as needed for indigestion or heartburn. 355 mL 0  . famotidine (PEPCID) 20 MG tablet Take 1 tablet (20 mg total) by mouth 2 (two) times daily. 10 tablet 0  . pantoprazole (PROTONIX) 40 MG tablet Take 40 mg by mouth daily.      No current facility-administered medications on file prior to visit.     ROS ROS otherwise unremarkable unless listed above.  Physical Examination: BP 134/84 (BP Location: Right Arm, Patient Position: Sitting, Cuff Size: Large)   Pulse 64   Temp 98.7 F (37.1 C) (Oral)   Resp 17   Ht 5' 6.5" (1.689 m)   Wt 237 lb (107.5 kg)   LMP 08/17/2016 (Exact Date)   SpO2 96%   BMI 37.68 kg/m  Ideal Body Weight: Weight in (lb) to have BMI = 25: 156.9  Physical Exam  Results for orders placed or performed in visit on 08/25/16  POCT Microscopic Urinalysis ([facility])  Result Value Ref Range   WBC,UR,HPF,POC None None WBC/hpf   RBC,UR,HPF,POC None None RBC/hpf   Bacteria Moderate (A) None, Too numerous to count   Mucus Absent Absent   Epithelial Cells, UR Per Microscopy Moderate (A) None, Too numerous to count cells/hpf  POCT urinalysis dipstick  Result Value Ref Range   Color, UA yellow yellow   Clarity, UA clear clear   Glucose, UA negative negative mg/dL   Bilirubin, UA negative negative   Ketones, POC UA large (80) (A) negative mg/dL   Spec Grav, UA 2.440 1.027 - 1.025   Blood, UA negative negative   pH, UA 5.5 5.0 -  8.0   Protein Ur, POC negative negative mg/dL   Urobilinogen, UA 0.2 0.2 or 1.0 E.U./dL   Nitrite, UA Negative Negative   Leukocytes, UA Negative Negative     Assessment and Plan: Latoya Counts is a 41 y.o. female who is here today  1. Dysuria *** - POCT Microscopic Urinalysis ([facility]) - POCT urinalysis dipstick   Trena Platt, PA-C [facility] 08/25/2016 3:01 PM

## 2016-08-25 NOTE — Progress Notes (Signed)
PRIMARY CARE AT [facility] [address], [place] [address] [phone]  Date:  08/25/2016   Name:  Latoya Brooks   DOB:  [birth year]   MRN:  [id number]  PCP:  Trena Platt, PA    History of Present Illness:  Latoya Brooks is a 41 y.o. female patient who presents to PCP with  Chief Complaint  Patient presents with  . Hospitalization Follow-up  . Dysuria    onset this am   Her symptoms have improved, however today she had the chest pressure that was reduced with bowel movements.  She was seen last week at the ED for chest pain.  Full work up performed and diagnosed with GERD.  Placed on protonix and maalox.  She reports that the protonix is helping, however she will have episodes of upper chest pain.  Relieved with belching, and bowel movements. No dizziness, sob, or nausea associated.  Not associtaed with exertion.    2nd complaint of dysuria, no frequency, and trace hematuria that started today.  Hurts when she urinates.  She found trace blood. Patient's last menstrual period was 08/17/2016 (exact date). No abnormal vaginal discharge.    Wt Readings from Last 3 Encounters:  08/25/16 237 lb (107.5 kg)  08/23/16 241 lb (109.3 kg)  08/20/16 241 lb (109.3 kg)     Patient Active Problem List   Diagnosis Date Noted  . Chest pain 08/24/2016  . Dyspnea 08/24/2016  . H. pylori infection 05/30/2015  . GERD (gastroesophageal reflux disease) 05/24/2015    Past Medical History:  Diagnosis Date  . Dysmenorrhea   . GERD (gastroesophageal reflux disease)    occasionally - med prn  . Pelvic inflammatory disease (PID)   . Preterm labor   . SVD (spontaneous vaginal delivery)    x 3    Past Surgical History:  Procedure Laterality Date  . CHOLECYSTECTOMY    . LAPAROSCOPIC TUBAL LIGATION Bilateral 07/08/2013   Procedure: LAPAROSCOPIC TUBAL LIGATION with Filshie clips;  Surgeon: Turner Daniels, MD;  Location: [facility];  Service: Gynecology;  Laterality: Bilateral;  . WISDOM TOOTH  EXTRACTION     general anesthesia    Social History  Substance Use Topics  . Smoking status: Current Some Day Smoker    Packs/day: 0.25    Years: 26.00    Types: Cigarettes  . Smokeless tobacco: Never Used  . Alcohol use 0.0 oz/week     Comment: occasional    No family history on file.  No Known Allergies  Medication list has been reviewed and updated.  Current Outpatient Prescriptions on File Prior to Visit  Medication Sig Dispense Refill  . alum & mag hydroxide-simeth (MAALOX/MYLANTA) 200-200-20 MG/5ML suspension Take 15 mLs by mouth every 6 (six) hours as needed for indigestion or heartburn. 355 mL 0  . famotidine (PEPCID) 20 MG tablet Take 1 tablet (20 mg total) by mouth 2 (two) times daily. 10 tablet 0  . pantoprazole (PROTONIX) 40 MG tablet Take 40 mg by mouth daily.     No current facility-administered medications on file prior to visit.     ROS ROS otherwise unremarkable unless listed above.  Physical Examination: BP 134/84 (BP Location: Right Arm, Patient Position: Sitting, Cuff Size: Large)   Pulse 64   Temp 98.7 F (37.1 C) (Oral)   Resp 17   Ht 5' 6.5" (1.689 m)   Wt 237 lb (107.5 kg)   LMP 08/17/2016 (Exact Date)   SpO2 96%   BMI 37.68 kg/m  Ideal  Body Weight: Weight in (lb) to have BMI = 25: 156.9  Physical Exam  Constitutional: She is oriented to person, place, and time. She appears well-developed and well-nourished. No distress.  HENT:  Head: Normocephalic and atraumatic.  Right Ear: External ear normal.  Left Ear: External ear normal.  Eyes: Conjunctivae and EOM are normal. Pupils are equal, round, and reactive to light.  Cardiovascular: Normal rate.   Pulmonary/Chest: Effort normal. No apnea. No respiratory distress. She has no decreased breath sounds. She has no wheezes. She has no rhonchi.  Genitourinary: Vagina normal and uterus normal. Pelvic exam was performed with patient supine. There is no rash on the right labia. There is no rash on  the left labia. Cervix exhibits discharge (minimal). Cervix exhibits no motion tenderness. Right adnexum displays no mass. Left adnexum displays no mass.  Neurological: She is alert and oriented to person, place, and time.  Skin: She is not diaphoretic.  Psychiatric: She has a normal mood and affect. Her behavior is normal.    Results for orders placed or performed in visit on 08/25/16  POCT Microscopic Urinalysis ([facility])  Result Value Ref Range   WBC,UR,HPF,POC None None WBC/hpf   RBC,UR,HPF,POC None None RBC/hpf   Bacteria Moderate (A) None, Too numerous to count   Mucus Absent Absent   Epithelial Cells, UR Per Microscopy Moderate (A) None, Too numerous to count cells/hpf  POCT urinalysis dipstick  Result Value Ref Range   Color, UA yellow yellow   Clarity, UA clear clear   Glucose, UA negative negative mg/dL   Bilirubin, UA negative negative   Ketones, POC UA large (80) (A) negative mg/dL   Spec Grav, UA 1.610 9.604 - 1.025   Blood, UA negative negative   pH, UA 5.5 5.0 - 8.0   Protein Ur, POC negative negative mg/dL   Urobilinogen, UA 0.2 0.2 or 1.0 E.U./dL   Nitrite, UA Negative Negative   Leukocytes, UA Negative Negative  POCT Wet + KOH Prep  Result Value Ref Range   Yeast by KOH Absent Absent   Yeast by wet prep Absent Absent   WBC by wet prep Moderate (A) Few   Clue Cells Wet Prep HPF POC Many (A) None   Trich by wet prep Absent Absent   Bacteria Wet Prep HPF POC Many (A) Few   Epithelial Cells By [employer] Pref ([facility]) None None, Few, Too numerous to count   RBC,UR,HPF,POC None None RBC/hpf     Assessment and Plan: Latoya Brooks is a 41 y.o. female who is here today for cc of dysuria, and hospitalization follow up of her chest pain.   Advised to continue the protonix.   Advised to continue the maalox.   She will continue the GERD diet. She is requesting consult with gastro, and I think this is fine.  She has hx of h pylori.  Given sxs, an endoscopy to obtain h pylori and  other observation may be warranted.  As she has gone to the ED, it will not be advantageous to put her off of PPI for this testing. Advised for her to follow up with cardiology consult.   Will treat with diflucan at this time.  Will obtain g/c/chl and urine culture in the meantime.  Metronidazole given as well.  Advised to use zinc oxide topically as well for some relief.  Dysuria - Plan: POCT Microscopic Urinalysis ([facility]), POCT urinalysis dipstick, POCT Wet + KOH Prep, Urine culture, fluconazole (DIFLUCAN) 150 MG tablet, GC/Chlamydia Probe Amp  Trena Platt, PA-C [facility] 4/17/20188:38 AM

## 2016-08-25 NOTE — Patient Instructions (Signed)
You can apply the desitin to the vaginal area.   I am giving you diflucan at this time. I will follow up with you about the results of your urine.

## 2016-08-26 LAB — URINE CULTURE: ORGANISM ID, BACTERIA: NO GROWTH

## 2016-08-26 MED ORDER — METRONIDAZOLE 500 MG PO TABS: 500.0000 mg | ORAL_TABLET | Freq: Two times a day (BID) | ORAL | 0 refills | Status: DC

## 2016-08-27 ENCOUNTER — Ambulatory Visit (INDEPENDENT_AMBULATORY_CARE_PROVIDER_SITE_OTHER): Payer: 59 | Admitting: Physician Assistant

## 2016-08-27 ENCOUNTER — Encounter: Payer: Self-pay | Admitting: Physician Assistant

## 2016-08-27 ENCOUNTER — Ambulatory Visit (INDEPENDENT_AMBULATORY_CARE_PROVIDER_SITE_OTHER): Payer: 59 | Admitting: Cardiology

## 2016-08-27 ENCOUNTER — Other Ambulatory Visit: Payer: Self-pay | Admitting: Physician Assistant

## 2016-08-27 ENCOUNTER — Encounter: Payer: Self-pay | Admitting: Cardiology

## 2016-08-27 VITALS — BP 116/72 | HR 98 | Temp 97.9°F | Resp 16 | Ht 66.5 in | Wt 233.2 lb

## 2016-08-27 VITALS — BP 114/74 | HR 92 | Ht 66.0 in | Wt 233.0 lb

## 2016-08-27 DIAGNOSIS — R002 Palpitations: Secondary | ICD-10-CM | POA: Diagnosis not present

## 2016-08-27 DIAGNOSIS — Z0000 Encounter for general adult medical examination without abnormal findings: Secondary | ICD-10-CM | POA: Diagnosis not present

## 2016-08-27 DIAGNOSIS — R079 Chest pain, unspecified: Secondary | ICD-10-CM | POA: Diagnosis not present

## 2016-08-27 DIAGNOSIS — R0602 Shortness of breath: Secondary | ICD-10-CM

## 2016-08-27 DIAGNOSIS — Z124 Encounter for screening for malignant neoplasm of cervix: Secondary | ICD-10-CM | POA: Diagnosis not present

## 2016-08-27 DIAGNOSIS — Z13220 Encounter for screening for lipoid disorders: Secondary | ICD-10-CM | POA: Diagnosis not present

## 2016-08-27 DIAGNOSIS — R55 Syncope and collapse: Secondary | ICD-10-CM | POA: Diagnosis not present

## 2016-08-27 DIAGNOSIS — Z1231 Encounter for screening mammogram for malignant neoplasm of breast: Secondary | ICD-10-CM

## 2016-08-27 LAB — GC/CHLAMYDIA PROBE AMP
CHLAMYDIA, DNA PROBE: NEGATIVE
Neisseria gonorrhoeae by PCR: NEGATIVE

## 2016-08-27 NOTE — Progress Notes (Signed)
PCP: Trena Platt, PA (Primary Care @ [place])  Clinic Note: Chief Complaint  Patient presents with  . New Evaluation    Consult: Heart flutter.  . Shortness of Breath  . Chest Pain  . Headache    HPI: Latoya Brooks is a 41 y.o. female who is being seen today for the evaluation of Palpitations associated with shortness of breath and chest pain at the request of Trena Platt, [place] ( Dr. Payton Mccallum) & now also for post-ER visit. Marland Kitchen  Latoya Dant was last seen on April 11 by Payton Mccallum (as a walk-in patient) she was noticing fluttering in her chest for at least 2 weeks. She also noted a sensation of feeling tight cast bloated sensation in her chest. The plan was referral for evaluation, unfortunately she went to the emergency room twice on the evening of April 14. At that time she felt palpitations with chest pain and dyspnea  Recent Hospitalizations: ER 4/14 x 2  Studies Reviewed: Chest x-ray during your visit was normal  Interval History: Latoya presents today to discuss her palpitation issues. Basically a little over 2 weeks ago she started feeling poorly in general. She felt a little tired more easily fatigable. She started having interim spells of fluttering sensation across her chest that would last up to maybe tenderness, but not longer than that and resolve spontaneously. The first week seem to be worse, but they continue to persist and she started noticing some pressure and tightness in her chest associated. All long she's noted that she feels short of breath when these episodes occur, but has not noted exertional shortness of breath otherwise. She has been dealing with GERD for a while and felt like maybe the pressure in her chest was-related and she tried to burp. I made somewhat better, but not completely. She says when these episodes occur, she'll feel short of breath, lightheaded and dizzy almost to the point of having the stages of pre-/near syncope. She has not had  frank syncope. No TIA or amaurosis fugax. She said that just yesterday she had 2 spells, but has been doing okay so far on the day of this evaluation..  With routine activities outside of these episodes, she really doesn't notice any exertional chest tightness or pressure. However when these episodes occur she says she feels a tightness in her epigastrium but also up in her throat. She thinks that maybe GI related, because it does happen often after she eats and probably with certain types of foods that are more fried fatty.  No melena, hematochezia, hematuria, or epstaxis. No claudication.  ROS: A comprehensive was performed. Review of Systems  Constitutional: Positive for malaise/fatigue (After these episodes, she feels very tired and fatigued). Negative for weight loss.  Respiratory: Positive for shortness of breath (With heart fluttering).   Cardiovascular: Positive for chest pain, palpitations and leg swelling (Intermittently).  Gastrointestinal: Positive for abdominal pain and heartburn.  Musculoskeletal: Positive for back pain and joint pain (Ankles and knees. Related to weight). Negative for falls.  Neurological: Positive for dizziness. Negative for focal weakness and seizures.  Endo/Heme/Allergies: Positive for environmental allergies.  Psychiatric/Behavioral: Negative for depression, memory loss and substance abuse. The patient has insomnia.   All other systems reviewed and are negative.   Past Medical History:  Diagnosis Date  . Dysmenorrhea   . GERD (gastroesophageal reflux disease)    occasionally - med prn  . Moderate obesity   . Pelvic inflammatory disease (PID)   . Preterm  labor   . SVD (spontaneous vaginal delivery)    x 3    Past Surgical History:  Procedure Laterality Date  . CHOLECYSTECTOMY    . LAPAROSCOPIC TUBAL LIGATION Bilateral 07/08/2013   Procedure: LAPAROSCOPIC TUBAL LIGATION with Filshie clips;  Surgeon: Turner Daniels, MD;  Location: [facility];  Service:  Gynecology;  Laterality: Bilateral;  . WISDOM TOOTH EXTRACTION     general anesthesia    Current Meds  Medication Sig  . alum & mag hydroxide-simeth (MAALOX/MYLANTA) 200-200-20 MG/5ML suspension Take 15 mLs by mouth every 6 (six) hours as needed for indigestion or heartburn.  . famotidine (PEPCID) 20 MG tablet Take 1 tablet (20 mg total) by mouth 2 (two) times daily.  . metroNIDAZOLE (FLAGYL) 500 MG tablet Take 1 tablet (500 mg total) by mouth 2 (two) times daily.  . pantoprazole (PROTONIX) 40 MG tablet Take 40 mg by mouth daily.    No Known Allergies  Social History   Social History  . Marital status: Single    Spouse name: N/A  . Number of children: 3  . Years of education: 62   Occupational History  . Order processor [employer]   Social History Main Topics  . Smoking status: Current Some Day Smoker    Packs/day: 0.25    Years: 26.00    Types: Cigarettes  . Smokeless tobacco: Never Used  . Alcohol use 0.0 oz/week     Comment: occasional  . Drug use: No  . Sexual activity: Yes    Birth control/ protection: Surgical   Other Topics Concern  . None   Social History Narrative   She does try to exercise regularly doing cardio and weights for maybe 30 minutes at a time 3 days a week.    family history includes Alcohol abuse (age of onset: 55) in her father; Cancer (age of onset: 18) in her mother; Cancer (age of onset: 30) in her maternal grandfather.  Wt Readings from Last 3 Encounters:  08/27/16 233 lb (105.7 kg)  08/27/16 233 lb 3.2 oz (105.8 kg)  08/25/16 237 lb (107.5 kg)    PHYSICAL EXAM BP 114/74   Pulse 92   Ht  (1.676 m)   Wt 233 lb (105.7 kg)   LMP 08/17/2016 (Exact Date)   BMI 37.61 kg/m  General appearance: alert, cooperative, appears stated age, no distress and moderetely obese; Well groomed HEENT: [place]/AT, EOMI, MMM, anicteric sclera Neck: no adenopathy, no carotid bruit and no JVD Lungs: clear to auscultation bilaterally, normal  percussion bilaterally and non-labored Heart: regular rate and rhythm, S1& S2 normal, no murmur, click, rub or gallop  - Unable to palpate PMI due to body habitus Abdomen: soft, non-tender; bowel sounds normal; no masses,  no organomegaly; no HJR Extremities: extremities normal, atraumatic, no cyanosis, and edema trivial Pulses: 2+ and symmetric;  Skin: mobility and turgor normal, no evidence of bleeding or bruising and no lesions noted or  Neurologic: Mental status: Alert& oriented x 3, thought content appropriate; pleasant mood and affect Cranial nerves: normal (II-XII grossly intact)    Adult ECG Report - PCP EKG 4/14 (personally reviewed   Rate: 63 ;  Rhythm: normal sinus rhythm and R-S-R'.  Otherwise normal axis, intervals and durations.;   Narrative Interpretation: Relatively normal EKG.   Other studies Reviewed: Additional studies/ records that were reviewed today include:  Recent Labs:   Lab Results  Component Value Date   CREATININE 0.63 08/23/2016   BUN 7 08/23/2016  NA 140 08/23/2016   K 3.9 08/23/2016   CL 106 08/23/2016   CO2 27 08/23/2016   Lab Results  Component Value Date   CHOL 171 08/27/2016   HDL 51 08/27/2016   LDLCALC 105 (H) 08/27/2016   TRIG 76 08/27/2016   CHOLHDL 3.4 08/27/2016    ASSESSMENT / PLAN: Problem List Items Addressed This Visit    Chest pain with low risk for cardiac etiology (Chronic)    I think the chest pain she is feeling is probably not cardiac, however the fact that it is associated with new onset of palpitations, he does warrant evaluation. I would like to see her echocardiogram looks like first as well as her monitor. This will begin see if symptoms can be explained Bernarda Caffey by these data otherwise I would consider at least a GXT -depending on the quality of echo and/or her ability to walk on treadmill.      Relevant Orders   ECHOCARDIOGRAM COMPLETE   Holter monitor - 48 hour   Dyspnea    She does have some exertional dyspnea,  probably related to her weight, but with routine activity she doesn't. She however was notably dyspneic when she was having a spell per her report.  Partly because the palpitations, and dyspnea as well as a difficult cardiac exam, we will check a 2-D echocardiogram to get a sense of her EF and diastolic function as well as any structural abnormalities.      Relevant Orders   ECHOCARDIOGRAM COMPLETE   Holter monitor - 48 hour   Intermittent palpitations - Primary    Her palpitations spells sound to me like they're probably consistent with short runs of PAT or PSVT. Sleep we would need to Current episode. He told me sounds like A. fib, and they seem to be lasting too long to be simply PACs and PVCs. We talked about vagal maneuvers to break them. We will have her wear a 48-hour monitor to hopefully capture couple episodes.  Because her cardiac exam was difficult due to body habitus, I cannot exclude findings consistent with mitral prolapse. There was no murmur, however we will evaluate with a 2-D echogram.      Relevant Orders   ECHOCARDIOGRAM COMPLETE   Holter monitor - 48 hour   Near syncope    Associated with tachypalpitations. Probably when they last longer than 5 minutes or so she starts to feel lightheaded and dizzy and as though if it doesn't stop she would pass out.  Plan: Check 48 hour Holter monitor, she seems to be having enough symptoms to capture 1. Discussed vagal maneuvers as these episodes do sound like SVT versus PAT.      Relevant Orders   ECHOCARDIOGRAM COMPLETE   Holter monitor - 48 hour      Current medicines are reviewed at length with the patient today. (+/- concerns) n/a The following changes have been made: n/a  Patient Instructions  SCHEDULE BOTH AT [address] Your physician has recommended that you wear an event monitor 48 HOUR. Event monitors are medical devices that record the heart's electrical activity. Doctors most often [place]  these monitors to diagnose arrhythmias. Arrhythmias are problems with the speed or rhythm of the heartbeat. The monitor is a small, portable device. You can wear one while you do your normal daily activities. This is usually used to diagnose what is causing palpitations/syncope (passing out).   Your physician has requested that you have an echocardiogram. Echocardiography is  a painless test that uses sound waves to create images of your heart. It provides your doctor with information about the size and shape of your heart and how well your heart's chambers and valves are working. This procedure takes approximately one hour. There are no restrictions for this procedure.  Recommendations for vagal maneuvers:  "Bearing down"  Coughing  Gagging  Cold stimulus to the face.ice cold water to drink   Your physician recommends that you schedule a follow-up appointment in 4-5 weeks f/u test results with DR Reona Zendejas.     Studies Ordered:   Orders Placed This Encounter  Procedures  . Holter monitor - 48 hour  . ECHOCARDIOGRAM COMPLETE      Bryan Lemma, M.D., M.S. Interventional Cardiologist   Pager # [phone] Phone # [phone] [address] [place], [place] [address]

## 2016-08-27 NOTE — Progress Notes (Signed)
PRIMARY CARE AT [facility] [address] [place] [address] [phone]  Date:  08/27/2016   Name:  Latoya Brooks   DOB:  1975/09/13   MRN:  [id number]  PCP:  Trena Platt, PA    History of Present Illness:  Latoya Brooks is a 41 y.o. female patient who presents to PCP with  Chief Complaint  Patient presents with  . Annual Exam    WITH PAP SMEAR  . Medication Refill    PROTONIX     DIET: She had no restrictions.  She was eating fast food.  Cooking 3-4 times per week.  Water intake has improved, 32oz of water.  Soda every day, caffeine free since the last 6 months.  Sweet teas rarely.     BM: yellow stool, loose stool, but formed.  No blood or black stool.  qd  URINATION: recent hx of dysuria, that has since resolved.  No hematuria, frequency, or dysuria.    SLEEP: fine.    SOCIAL ACTIVITY: go to the gym.  3 children 18, 16, 3.  Hair done, and pedicures.  Go the movies.  Goes to the gym, "on contract", 3 days per week.  Currently doing a boot camp.  "16 week challenge" Vaginal births.  Hx of chlamydia, gonorrhea, trichomoniasis several years ago.  Last pap 1 year ago: normal.  Hx of abnormal smear over 10 years ago.  Followed by [facility].   Menses: regular  Patient Active Problem List   Diagnosis Date Noted  . Chest pain 08/24/2016  . Dyspnea 08/24/2016  . H. pylori infection 05/30/2015  . GERD (gastroesophageal reflux disease) 05/24/2015    Past Medical History:  Diagnosis Date  . Dysmenorrhea   . GERD (gastroesophageal reflux disease)    occasionally - med prn  . Pelvic inflammatory disease (PID)   . Preterm labor   . SVD (spontaneous vaginal delivery)    x 3    Past Surgical History:  Procedure Laterality Date  . CHOLECYSTECTOMY    . LAPAROSCOPIC TUBAL LIGATION Bilateral 07/08/2013   Procedure: LAPAROSCOPIC TUBAL LIGATION with Filshie clips;  Surgeon: Turner Daniels, MD;  Location: [facility];  Service: Gynecology;  Laterality: Bilateral;   . WISDOM TOOTH EXTRACTION     general anesthesia    Social History  Substance Use Topics  . Smoking status: Current Some Day Smoker    Packs/day: 0.25    Years: 26.00    Types: Cigarettes  . Smokeless tobacco: Never Used  . Alcohol use 0.0 oz/week     Comment: occasional    No family history on file.  No Known Allergies  Medication list has been reviewed and updated.  Current Outpatient Prescriptions on File Prior to Visit  Medication Sig Dispense Refill  . alum & mag hydroxide-simeth (MAALOX/MYLANTA) 200-200-20 MG/5ML suspension Take 15 mLs by mouth every 6 (six) hours as needed for indigestion or heartburn. 355 mL 0  . famotidine (PEPCID) 20 MG tablet Take 1 tablet (20 mg total) by mouth 2 (two) times daily. 10 tablet 0  . metroNIDAZOLE (FLAGYL) 500 MG tablet Take 1 tablet (500 mg total) by mouth 2 (two) times daily. 14 tablet 0  . pantoprazole (PROTONIX) 40 MG tablet Take 40 mg by mouth daily.     No current facility-administered medications on file prior to visit.     Review of Systems  Constitutional: Negative for chills and fever.  HENT: Negative for ear discharge, ear pain and sore throat.  Eyes: Negative for blurred vision and double vision.  Respiratory: Negative for cough, shortness of breath and wheezing.   Cardiovascular: Negative for chest pain, palpitations and leg swelling.  Gastrointestinal: Negative for diarrhea, nausea and vomiting.  Genitourinary: Negative for dysuria, frequency and hematuria.  Skin: Positive for rash (skin lesions at the pubic area). Negative for itching.  Neurological: Negative for dizziness and headaches.   ROS otherwise unremarkable unless listed above.  Physical Examination: BP 116/72 (BP Location: Right Arm, Patient Position: Sitting, Cuff Size: Large)   Pulse 98   Temp 97.9 F (36.6 C) (Oral)   Resp 16   Ht 5' 6.5" (1.689 m)   Wt 233 lb 3.2 oz (105.8 kg)   LMP 08/17/2016 (Exact Date)   SpO2 97%   BMI 37.08 kg/m   Ideal Body Weight: Weight in (lb) to have BMI = 25: 156.9  Physical Exam  Constitutional: She is oriented to person, place, and time. She appears well-developed and well-nourished. No distress.  HENT:  Head: Normocephalic and atraumatic.  Right Ear: Tympanic membrane, external ear and ear canal normal.  Left Ear: Tympanic membrane, external ear and ear canal normal.  Nose: Right sinus exhibits no maxillary sinus tenderness and no frontal sinus tenderness. Left sinus exhibits no maxillary sinus tenderness and no frontal sinus tenderness.  Mouth/Throat: Oropharynx is clear and moist. No uvula swelling. No oropharyngeal exudate, posterior oropharyngeal edema or posterior oropharyngeal erythema.  Eyes: Conjunctivae and EOM are normal. Pupils are equal, round, and reactive to light.  Neck: Normal range of motion. Neck supple. No thyromegaly present.  Cardiovascular: Normal rate, regular rhythm, normal heart sounds and intact distal pulses.  Exam reveals no gallop, no distant heart sounds and no friction rub.   No murmur heard. Pulmonary/Chest: Effort normal and breath sounds normal. No apnea. No respiratory distress. She has no decreased breath sounds. She has no wheezes. She has no rhonchi.  Abdominal: Soft. Bowel sounds are normal. She exhibits no distension and no mass. There is no hepatosplenomegaly. There is no tenderness.  Genitourinary: Vagina normal. Pelvic exam was performed with patient supine. There is no rash on the right labia. There is no rash on the left labia. Cervix exhibits no motion tenderness, no discharge and no friability.  Musculoskeletal: Normal range of motion. She exhibits no edema or tenderness.  Lymphadenopathy:       Head (right side): No submandibular, no tonsillar, no preauricular and no posterior auricular adenopathy present.       Head (left side): No submandibular, no tonsillar, no preauricular and no posterior auricular adenopathy present.    She has no cervical  adenopathy.  Neurological: She is alert and oriented to person, place, and time. No cranial nerve deficit. She exhibits normal muscle tone. Coordination normal.  Skin: Skin is warm and dry. She is not diaphoretic.  Scarring and open comedones scantly placed along the groin area.  Psychiatric: She has a normal mood and affect. Her behavior is normal.     Assessment and Plan: Latoya Brooks is a 41 y.o. female who is here today for cc of annual physical exam. Annual physical exam - Plan: Lipid panel, Pap IG w/ reflex to HPV when ASC-U, CANCELED: MM DIGITAL SCREENING BILATERAL, CANCELED: MM DIAG BREAST W/IMPLANT TOMO UNI R  Screening for lipid disorders - Plan: Lipid panel  Screening for cervical cancer - Plan: Pap IG w/ reflex to HPV when ASC-U  Trena Platt, PA-C [facility] 4/18/20182:19  PM

## 2016-08-27 NOTE — Patient Instructions (Addendum)
Please await contact for the mammogram.  Call the office if you hear nothing in the next 2 weeks. Increase water intake to 64 oz of water per day. Hidradenitis Suppurativa Hidradenitis suppurativa is a long-term (chronic) skin disease that starts with blocked sweat glands or hair follicles. Bacteria may grow in these blocked openings of your skin. Hidradenitis suppurativa is like a severe form of acne that develops in areas of your body where acne would be unusual. It is most likely to affect the areas of your body where skin rubs against skin and becomes moist. This includes your:  Underarms.  Groin.  Genital areas.  Buttocks.  Upper thighs.  Breasts. Hidradenitis suppurativa may start out with small pimples. The pimples can develop into deep sores that break open (rupture) and drain pus. Over time your skin may thicken and become scarred. Hidradenitis suppurativa cannot be passed from person to person. What are the causes? The exact cause of hidradenitis suppurativa is not known. This condition may be due to:  Female and female hormones. The condition is rare before and after puberty.  An overactive body defense system (immune system). Your immune system may overreact to the blocked hair follicles or sweat glands and cause swelling and pus-filled sores. What increases the risk? You may have a higher risk of hidradenitis suppurativa if you:  Are a woman.  Are between ages 33 and 57.  Have a family history of hidradenitis suppurativa.  Have a personal history of acne.  Are overweight.  Smoke.  Take the drug lithium. What are the signs or symptoms? The first signs of an outbreak are usually painful skin bumps that look like pimples. As the condition progresses:  Skin bumps may get bigger and grow deeper into the skin.  Bumps under the skin may rupture and drain smelly pus.  Skin may become itchy and infected.  Skin may thicken and scar.  Drainage may continue through  tunnels under the skin (fistulas).  Walking and moving your arms can become painful. How is this diagnosed? Your health care provider may diagnose hidradenitis suppurativa based on your medical history and your signs and symptoms. A physical exam will also be done. You may need to see a health care provider who specializes in skin diseases (dermatologist). You may also have tests done to confirm the diagnosis. These can include:  Swabbing a sample of pus or drainage from your skin so it can be sent to the lab and tested for infection.  Blood tests to check for infection. How is this treated? The same treatment will not work for everybody with hidradenitis suppurativa. Your treatment will depend on how severe your symptoms are. You may need to try several treatments to find what works best for you. Part of your treatment may include cleaning and bandaging (dressing) your wounds. You may also have to take medicines, such as the following:  Antibiotics.  Acne medicines.  Medicines to block or suppress the immune system.  A diabetes medicine (metformin) is sometimes used to treat this condition.  For women, birth control pills can sometimes help relieve symptoms. You may need surgery if you have a severe case of hidradenitis suppurativa that does not respond to medicine. Surgery may involve:  Using a laser to clear the skin and remove hair follicles.  Opening and draining deep sores.  Removing the areas of skin that are diseased and scarred. Follow these instructions at home:  Learn as much as you can about your disease, and work  closely with your health care providers.  Take medicines only as directed by your health care provider.  If you were prescribed an antibiotic medicine, finish it all even if you start to feel better.  If you are overweight, losing weight may be very helpful. Try to reach and maintain a healthy weight.  Do not use any tobacco products, including cigarettes,  chewing tobacco, or electronic cigarettes. If you need help quitting, ask your health care provider.  Do not shave the areas where you get hidradenitis suppurativa.  Do not wear deodorant.  Wear loose-fitting clothes.  Try not to overheat and get sweaty.  Take a daily bleach bath as directed by your health care provider.  Fill your bathtub halfway with water.  Pour in  cup of unscented household bleach.  Soak for 5-10 minutes.  Cover sore areas with a warm, clean washcloth (compress) for 5-10 minutes. Contact a health care provider if:  You have a flare-up of hidradenitis suppurativa.  You have chills or a fever.  You are having trouble controlling your symptoms at home. This information is not intended to replace advice given to you by your health care provider. Make sure you discuss any questions you have with your health care provider. Document Released: 12/11/2003 Document Revised: 10/04/2015 Document Reviewed: 07/29/2013 Elsevier Interactive Patient Education  2017 [employer].  Keeping You Healthy  Get These Tests 1. Blood Pressure- Have your blood pressure checked once a year by your health care provider.  Normal blood pressure is 120/80. 2. Weight- Have your body mass index (BMI) calculated to screen for obesity.  BMI is measure of body fat based on height and weight.  You can also calculate your own BMI at https://www.west-esparza.com/. 3. Cholesterol- Have your cholesterol checked every 5 years starting at age 68 then yearly starting at age 72. 4. Chlamydia, HIV, and other sexually transmitted diseases- Get screened every year until age 21, then within three months of each new sexual provider. 5. Pap Test - Every 1-5 years; discuss with your health care provider. 6. Mammogram- Every 1-2 years starting at age 41--50  Take these medicines  Calcium with Vitamin D-Your body needs 1200 mg of Calcium each day and [phone] IU of Vitamin D daily.  Your body can only absorb  500 mg of Calcium at a time so Calcium must be taken in 2 or 3 divided doses throughout the day.  Multivitamin with folic acid- Once daily if it is possible for you to become pregnant.  Get these Immunizations  Gardasil-Series of three doses; prevents HPV related illness such as genital warts and cervical cancer.  Menactra-Single dose; prevents meningitis.  Tetanus shot- Every 10 years.  Flu shot-Every year.  Take these steps 1. Do not smoke-Your healthcare provider can help you quit.  For tips on how to quit go to www.smokefree.gov or call 1-800 QUITNOW. 2. Be physically active- Exercise 5 days a week for at least 30 minutes.  If you are not already physically active, start slow and gradually work up to 30 minutes of moderate physical activity.  Examples of moderate activity include walking briskly, dancing, swimming, bicycling, etc. 3. Breast Cancer- A self breast exam every month is important for early detection of breast cancer.  For more information and instruction on self breast exams, ask your healthcare provider or [email or web address].[email or web address]. 4. Eat a healthy diet- Eat a variety of healthy foods such as fruits, vegetables, whole grains, low fat milk, low fat cheeses, yogurt, lean meats, poultry and  fish, beans, nuts, tofu, etc.  For more information go to www. Thenutritionsource.org 5. Drink alcohol in moderation- Limit alcohol intake to one drink or less per day. Never drink and drive. 6. Depression- Your emotional health is as important as your physical health.  If you're feeling down or losing interest in things you normally enjoy please talk to your healthcare provider about being screened for depression. 7. Dental visit- Brush and floss your teeth twice daily; visit your dentist twice a year. 8. Eye doctor- Get an eye exam at least every 2 years. 9. Helmet use- Always wear a helmet when riding a bicycle, motorcycle, rollerblading or skateboarding. 10. Safe sex-  If you may be exposed to sexually transmitted infections, use a condom. 11. Seat belts- Seat belts can save your live; always wear one. 12. Smoke/Carbon Monoxide detectors- These detectors need to be installed on the appropriate level of your home. Replace batteries at least once a year. 13. Skin cancer- When out in the sun please cover up and use sunscreen 15 SPF or higher. 14. Violence- If anyone is threatening or hurting you, please tell your healthcare provider.          IF you received an x-ray today, you will receive an invoice from [facility]. Please contact [facility] at [phone] with questions or concerns regarding your invoice.   IF you received labwork today, you will receive an invoice from [facility]. Please contact [facility] at [phone] with questions or concerns regarding your invoice.   Our billing staff will not be able to assist you with questions regarding bills from these companies.  You will be contacted with the lab results as soon as they are available. The fastest way to get your results is to activate your My Chart account. Instructions are located on the last page of this paperwork. If you have not heard from [place] regarding the results in 2 weeks, please contact this office.    We recommend that you schedule a mammogram for breast cancer screening. Typically, you do not need a referral to do this. Please contact a local imaging center to schedule your mammogram.  [facility] - [phone]  *ask for the Radiology Department [facility]) - [phone] or [phone]  [facility] - [phone] [facility] - [phone] [facility] - [phone]  *ask for the Radiology Department [facility] - [phone]  *ask for the Radiology Department [facility] - [phone]  *ask for the Mammography Department [facility] - [phone]

## 2016-08-27 NOTE — Patient Instructions (Addendum)
SCHEDULE BOTH AT [address] Your physician has recommended that you wear an event monitor 48 HOUR. Event monitors are medical devices that record the heart's electrical activity. Doctors most often [place] these monitors to diagnose arrhythmias. Arrhythmias are problems with the speed or rhythm of the heartbeat. The monitor is a small, portable device. You can wear one while you do your normal daily activities. This is usually used to diagnose what is causing palpitations/syncope (passing out).   Your physician has requested that you have an echocardiogram. Echocardiography is a painless test that uses sound waves to create images of your heart. It provides your doctor with information about the size and shape of your heart and how well your heart's chambers and valves are working. This procedure takes approximately one hour. There are no restrictions for this procedure.  Recommendations for vagal maneuvers:  "Bearing down"  Coughing  Gagging  Cold stimulus to the face.ice cold water to drink   Your physician recommends that you schedule a follow-up appointment in 4-5 weeks f/u test results with DR HARDING.

## 2016-08-28 ENCOUNTER — Encounter: Payer: Self-pay | Admitting: Cardiology

## 2016-08-28 LAB — PAP IG W/ RFLX HPV ASCU: PAP Smear Comment: 0

## 2016-08-28 LAB — LIPID PANEL
CHOL/HDL RATIO: 3.4 ratio (ref 0.0–4.4)
CHOLESTEROL TOTAL: 171 mg/dL (ref 100–199)
HDL: 51 mg/dL (ref >39)
LDL Calculated: 105 mg/dL — ABNORMAL HIGH (ref 0–99)
Triglycerides: 76 mg/dL (ref 0–149)
VLDL CHOLESTEROL CAL: 15 mg/dL (ref 5–40)

## 2016-08-29 NOTE — Assessment & Plan Note (Signed)
I think the chest pain she is feeling is probably not cardiac, however the fact that it is associated with new onset of palpitations, he does warrant evaluation. I would like to see her echocardiogram looks like first as well as her monitor. This will begin see if symptoms can be explained Latoya Brooks by these data otherwise I would consider at least a GXT -depending on the quality of echo and/or her ability to walk on treadmill.

## 2016-08-29 NOTE — Assessment & Plan Note (Signed)
Associated with tachypalpitations. Probably when they last longer than 5 minutes or so she starts to feel lightheaded and dizzy and as though if it doesn't stop she would pass out.  Plan: Check 48 hour Holter monitor, she seems to be having enough symptoms to capture 1. Discussed vagal maneuvers as these episodes do sound like SVT versus PAT.

## 2016-08-29 NOTE — Assessment & Plan Note (Signed)
Her palpitations spells sound to me like they're probably consistent with short runs of PAT or PSVT. Sleep we would need to Current episode. He told me sounds like A. fib, and they seem to be lasting too long to be simply PACs and PVCs. We talked about vagal maneuvers to break them. We will have her wear a 48-hour monitor to hopefully capture couple episodes.  Because her cardiac exam was difficult due to body habitus, I cannot exclude findings consistent with mitral prolapse. There was no murmur, however we will evaluate with a 2-D echogram.

## 2016-08-29 NOTE — Assessment & Plan Note (Signed)
She does have some exertional dyspnea, probably related to her weight, but with routine activity she doesn't. She however was notably dyspneic when she was having a spell per her report.  Partly because the palpitations, and dyspnea as well as a difficult cardiac exam, we will check a 2-D echocardiogram to get a sense of her EF and diastolic function as well as any structural abnormalities.

## 2016-09-06 ENCOUNTER — Encounter: Payer: Self-pay | Admitting: Family Medicine

## 2016-09-06 ENCOUNTER — Ambulatory Visit (INDEPENDENT_AMBULATORY_CARE_PROVIDER_SITE_OTHER): Payer: 59 | Admitting: Family Medicine

## 2016-09-06 VITALS — BP 109/74 | HR 83 | Temp 98.6°F | Resp 16 | Ht 67.5 in | Wt 235.0 lb

## 2016-09-06 DIAGNOSIS — K219 Gastro-esophageal reflux disease without esophagitis: Secondary | ICD-10-CM

## 2016-09-06 MED ORDER — PANTOPRAZOLE SODIUM 40 MG PO TBEC: 40.0000 mg | DELAYED_RELEASE_TABLET | Freq: Two times a day (BID) | ORAL | 3 refills | Status: DC

## 2016-09-06 NOTE — Patient Instructions (Addendum)
We really need to have you off all reflux medicines before testing for H.pylori.  For right now, take the Protonix twice a day for the next 4 weeks.  This would be the treatment dose for an ulcer.  At that point it back down to 1 per day.  At that point if you're still having issues you should really look into having the endoscopy. This would also be able to test for H. pylori.    IF you received an x-ray today, you will receive an invoice from [facility]. Please contact [facility] at [phone] with questions or concerns regarding your invoice.   IF you received labwork today, you will receive an invoice from [place]. Please contact [facility] at [phone] with questions or concerns regarding your invoice.   Our billing staff will not be able to assist you with questions regarding bills from these companies.  You will be contacted with the lab results as soon as they are available. The fastest way to get your results is to activate your My Chart account. Instructions are located on the last page of this paperwork. If you have not heard from [place] regarding the results in 2 weeks, please contact this office.

## 2016-09-06 NOTE — Progress Notes (Signed)
Latoya Brooks is a 41 y.o. female who presents to Primary Care at [facility] today for FU for GERD:  1.  Epigastric abdominal pain:  Present for years. However this is only recently worsened. She has been taking Protonix and Pepcid before diagnosed reflux. She is also seeing gastroenterology and has been recommended for EGD but she cannot afford this with her deductible.  She describes reflux worse after certain meals. She also has epigastric burning if she goes 6 hours or more without eating any meals. She the past few weeks has noticed globus sensation 5 out of 7 days a week.  She denies any actual dysphagia or odynophagia. No cough. She tries to sit upright after meals which help somewhat with her symptoms. She is currently on Protonix once a day.  In the past she's been treated for H. pylori. She has no hematochezia. No nausea or vomiting. No melena. No orthostatic symptoms.  ROS as above.  Pertinently, no chest pain, palpitations, SOB, Fever, Chills, Abd pain, N/V/D.   PMH reviewed. Patient is a nonsmoker.   Past Medical History:  Diagnosis Date  . Dysmenorrhea   . GERD (gastroesophageal reflux disease)    occasionally - med prn  . Moderate obesity   . Pelvic inflammatory disease (PID)   . Preterm labor   . SVD (spontaneous vaginal delivery)    x 3   Past Surgical History:  Procedure Laterality Date  . CHOLECYSTECTOMY    . LAPAROSCOPIC TUBAL LIGATION Bilateral 07/08/2013   Procedure: LAPAROSCOPIC TUBAL LIGATION with Filshie clips;  Surgeon: Turner Daniels, MD;  Location: [facility];  Service: Gynecology;  Laterality: Bilateral;  . WISDOM TOOTH EXTRACTION     general anesthesia    Medications reviewed. Current Outpatient Prescriptions  Medication Sig Dispense Refill  . metroNIDAZOLE (FLAGYL) 500 MG tablet Take 1 tablet (500 mg total) by mouth 2 (two) times daily. 14 tablet 0  . pantoprazole (PROTONIX) 40 MG tablet Take 40 mg by mouth daily.    Marland Kitchen alum & mag hydroxide-simeth  (MAALOX/MYLANTA) 200-200-20 MG/5ML suspension Take 15 mLs by mouth every 6 (six) hours as needed for indigestion or heartburn. (Patient not taking: Reported on 09/06/2016) 355 mL 0  . famotidine (PEPCID) 20 MG tablet Take 1 tablet (20 mg total) by mouth 2 (two) times daily. (Patient not taking: Reported on 09/06/2016) 10 tablet 0   No current facility-administered medications for this visit.      Physical Exam:  BP 109/74   Pulse 83   Temp 98.6 F (37 C)   Resp 16   Ht 5' 7.5" (1.715 m)   Wt 235 lb (106.6 kg)   LMP 08/17/2016 (Exact Date)   SpO2 97%   BMI 36.26 kg/m  Gen:  Alert, cooperative patient who appears stated age in no acute distress.  Vital signs reviewed. HEENT: EOMI,  MMM Pulm:  Clear to auscultation bilaterally with good air movement.  No wheezes or rales noted.   Cardiac:  Regular rate and rhythm without murmur auscultated.  Good S1/S2. Abd:  Soft/nondistended/mild tenderness with epigastric palpation. Good bowel sounds throughout. No guarding or rebound.   Assessment and Plan:  1.  GERD: Non-with globus sensation. -She has had long-standing reflux symptoms for the past 18 years or so. These have previously been intermittent but now they are fairly constant.  -strongly encouraged her to go through endoscopy. She is going to check [facility] to see about financial aid.  - For the next month will do twice  a day Protonix. -At that point drop back down to 1 pill per day. -She would need to be off all PPIs for at least a week before testing for test of cure for H. pylori. -She would rather stay on the medicine and have testing during EGD.

## 2016-09-08 ENCOUNTER — Ambulatory Visit (INDEPENDENT_AMBULATORY_CARE_PROVIDER_SITE_OTHER): Payer: 59

## 2016-09-08 ENCOUNTER — Ambulatory Visit (HOSPITAL_COMMUNITY): Payer: 59 | Attending: Cardiology

## 2016-09-08 ENCOUNTER — Other Ambulatory Visit: Payer: Self-pay

## 2016-09-08 ENCOUNTER — Other Ambulatory Visit: Payer: Self-pay | Admitting: Emergency Medicine

## 2016-09-08 DIAGNOSIS — R55 Syncope and collapse: Secondary | ICD-10-CM | POA: Diagnosis not present

## 2016-09-08 DIAGNOSIS — R002 Palpitations: Secondary | ICD-10-CM

## 2016-09-08 DIAGNOSIS — R0602 Shortness of breath: Secondary | ICD-10-CM | POA: Diagnosis present

## 2016-09-08 DIAGNOSIS — Z87891 Personal history of nicotine dependence: Secondary | ICD-10-CM | POA: Diagnosis not present

## 2016-09-08 DIAGNOSIS — Z6836 Body mass index (BMI) 36.0-36.9, adult: Secondary | ICD-10-CM | POA: Insufficient documentation

## 2016-09-08 DIAGNOSIS — R079 Chest pain, unspecified: Secondary | ICD-10-CM | POA: Diagnosis not present

## 2016-09-09 ENCOUNTER — Telehealth: Payer: Self-pay

## 2016-09-09 NOTE — Telephone Encounter (Signed)
PATIENT SAW DR. Gwendolyn Grant ON Saturday (09/06/16) FOR AN ULCER. TODAY SHE SAW BRIGHT RED BLOOD IN HER STOOL THAT LOOKED FLAKY. THIS WAS HER FIRST BM OF THE DAY. THERE WAS A MODERATE AMOUNT. YESTERDAY HER STOOL WAS DARK. SHE WOULD LIKE TO KNOW WHAT SHE SHOULD DO NEXT? BEST PHONE [phone] (CELL) PHARMACY CHOICE IS [facility] ON [address]. MBC

## 2016-09-09 NOTE — Telephone Encounter (Signed)
I spoke with chelle and she advised I tell  Pt to come back in this week with stephanie or [facility] and go to er if dizziness, fatigue, increased bleeding (none now per pt) Transferred up front for an appt.

## 2016-09-09 NOTE — Telephone Encounter (Signed)
Please see ov notes, at that time no blood in stool  Recommended endoscopy and on gerd med.  What is advice?

## 2016-09-10 ENCOUNTER — Encounter: Payer: Self-pay | Admitting: Family Medicine

## 2016-09-10 ENCOUNTER — Ambulatory Visit (INDEPENDENT_AMBULATORY_CARE_PROVIDER_SITE_OTHER): Payer: 59 | Admitting: Family Medicine

## 2016-09-10 VITALS — BP 127/80 | HR 75 | Temp 97.6°F | Resp 18 | Ht 67.01 in | Wt 236.0 lb

## 2016-09-10 DIAGNOSIS — K625 Hemorrhage of anus and rectum: Secondary | ICD-10-CM

## 2016-09-10 LAB — IFOBT (OCCULT BLOOD): IFOBT: NEGATIVE

## 2016-09-10 MED ORDER — HYDROCORTISONE ACETATE 25 MG RE SUPP: 25.0000 mg | Freq: Two times a day (BID) | RECTAL | 0 refills | Status: DC

## 2016-09-10 NOTE — Progress Notes (Signed)
Latoya Brooks is a 41 y.o. female who presents to Primary Care at [facility] today for rectal bleeding.  1.  Presenting today with one episode of rectal bleeding. Recently seen on 4/28 with epigastric tenderness and prescribed high dose PPI. Was noted to be unable to afford endoscopy at that visit. Today, reports one episode of bright red blood mixed in stool and on toilet paper yesterday. Denies blood in toilet bowl. Bowel movement was not painful and she did not strain. Denies constipation, reporting stools daily whoever "slightly harder" than normal. Has had several bowel movements since yesterday, none with blood noted; these were brown in color. States she is positive blood is from rectum and not vaginal. LMP one month ago. Denies dizziness, fatigue, shortness of breath. Epigastric pain has resolved. Scheduled for Endoscopy later this month.   ROS as above.  Pertinently, no chest pain, palpitations, SOB, Fever, Chills, Abd pain, N/V/D.   PMH reviewed. Patient is a nonsmoker.   Past Medical History:  Diagnosis Date  . Dysmenorrhea   . GERD (gastroesophageal reflux disease)    occasionally - med prn  . Moderate obesity   . Pelvic inflammatory disease (PID)   . Preterm labor   . SVD (spontaneous vaginal delivery)    x 3   Past Surgical History:  Procedure Laterality Date  . CHOLECYSTECTOMY    . LAPAROSCOPIC TUBAL LIGATION Bilateral 07/08/2013   Procedure: LAPAROSCOPIC TUBAL LIGATION with Filshie clips;  Surgeon: Turner Daniels, MD;  Location: [facility];  Service: Gynecology;  Laterality: Bilateral;  . WISDOM TOOTH EXTRACTION     general anesthesia    Medications reviewed. Current Outpatient Prescriptions  Medication Sig Dispense Refill  . metroNIDAZOLE (FLAGYL) 500 MG tablet Take 1 tablet (500 mg total) by mouth 2 (two) times daily. 14 tablet 0  . pantoprazole (PROTONIX) 40 MG tablet Take 1 tablet (40 mg total) by mouth daily. 30 tablet 0  . pantoprazole (PROTONIX) 40 MG tablet Take 40 mg  by mouth daily.    . pantoprazole (PROTONIX) 40 MG tablet Take 1 tablet (40 mg total) by mouth 2 (two) times daily. X 4 weeks 60 tablet 3  . alum & mag hydroxide-simeth (MAALOX/MYLANTA) 200-200-20 MG/5ML suspension Take 15 mLs by mouth every 6 (six) hours as needed for indigestion or heartburn. (Patient not taking: Reported on 09/06/2016) 355 mL 0  . famotidine (PEPCID) 20 MG tablet Take 1 tablet (20 mg total) by mouth 2 (two) times daily. (Patient not taking: Reported on 09/06/2016) 10 tablet 0   No current facility-administered medications for this visit.    Physical Exam:  BP 127/80 (BP Location: Right Arm, Patient Position: Sitting, Cuff Size: Large)   Pulse 75   Temp 97.6 F (36.4 C) (Oral)   Resp 18   Ht 5' 7.01" (1.702 m)   Wt 236 lb (107 kg)   LMP 08/17/2016 (Exact Date)   SpO2 99%   BMI 36.95 kg/m  Gen:  Alert, cooperative patient who appears stated age in no acute distress.  Vital signs reviewed. HEENT: EOMI,  MMM Pulm:  Clear to auscultation bilaterally with good air movement.  No wheezes or rales noted.   Cardiac:  Regular rate and rhythm without murmur auscultated.  Good S1/S2. Abd:  Soft/nondistended/nontender.  Good bowel sounds throughout all four quadrants.  No masses noted. Scar noted in right upper quadrant (note history of cholecystectomy).  Rectal: Small internal hemorrhoid noted at 5o'clock position, nontender, reducible. Exts: Non edematous BL  LE, warm  and well perfused.   Assessment and Plan:  1.  Rectal Bleeding: Resolved. Suspect secondary to hemorrhoid noted on exam. FOBT negative. Recommend keeping appointment with GI for endoscopy and possible colonoscopy. Recommend watchful waiting at this time. Did not obtain CBC--normal CBC on 08/23/16 and only one episode of bleeding noted. Did prescribe Anusol for use if needed for pain or mild bleeding. Return precautions given, including significant bleeding, shortness of breath, fatigue, or dizziness.

## 2016-09-10 NOTE — Patient Instructions (Addendum)
Lower Gastrointestinal Bleeding °Lower gastrointestinal (GI) bleeding is the result of bleeding from the colon, rectum, or anal area. The colon is the last part of the digestive tract, where stool, also called feces, is formed. If you have lower GI bleeding, you may see blood in or on your stool. It may be bright red. °Lower GI bleeding often stops without treatment. Continued or heavy bleeding needs emergency treatment at the hospital. °What are the causes? °Lower GI bleeding may be caused by: °· A condition that causes pouches to form in the colon over time (diverticulosis). °· Swelling and irritation (inflammation) in areas with diverticulosis (diverticulitis). °· Inflammation of the colon (inflammatory bowel disease). °· Swollen veins in the rectum (hemorrhoids). °· Painful tears in the anus (anal fissures), often caused by passing hard stools. °· Cancer of the colon or rectum. °· Noncancerous growths (polyps) of the colon or rectum. °· A bleeding disorder that impairs the formation of blood clots and causes easy bleeding (coagulopathy). °· An abnormal weakening of a blood vessel where an artery and a vein come together (arteriovenous malformation). °What increases the risk? °You are more likely to develop this condition if: °· You are older than 43 years of age. °· You take aspirin or NSAIDs on a regular basis. °· You take anticoagulant or antiplatelet drugs. °· You have a history of high-dose X-ray treatment (radiation therapy) of the colon. °· You recently had a colon polyp removed. °What are the signs or symptoms? °Symptoms of this condition include: °· Bright red blood or blood clots coming from your rectum. °· Bloody stools. °· Black or maroon-colored stools. °· Pain or cramping in the abdomen. °· Weakness or dizziness. °· Racing heartbeat. °How is this diagnosed? °This condition may be diagnosed based on: °· Your symptoms and medical history. °· A physical exam. During the exam, your health care provider  will check for signs of blood loss, such as low blood pressure and a rapid pulse. °· Tests, such as: °¨ Flexible sigmoidoscopy. In this procedure, a flexible tube with a camera on the end is used to examine your anus and the first part of your colon to look for the source of bleeding. °¨ Colonoscopy. This is similar to a flexible sigmoidoscopy, but the camera can extend all the way to the uppermost part of your colon. °¨ Blood tests to measure your red blood cell count and to check for coagulopathy. °¨ An imaging study of your colon to look for a bleeding site. In some cases, you may have X-rays taken after a dye or radioactive substance is injected into your bloodstream (angiogram). °How is this treated? °Treatment for this condition depends on the cause of the bleeding. Heavy or persistent bleeding is treated at the hospital. Treatment may include: °· Getting fluids through an IV tube inserted into one of your veins. °· Getting blood through an IV tube (blood transfusion). °· Stopping bleeding through high-heat coagulation, injections of certain medicines, or applying surgical clips. This can all be done during a colonoscopy. °· Having a procedure that involves first doing an angiogram and then blocking blood flow to the bleeding site (embolization). °· Stopping some of your regular medicines for a certain amount of time. °· Having surgery to remove part of the colon. This may be needed if bleeding is severe and does not respond to other treatment. °Follow these instructions at home: °· Take over-the-counter and prescription medicines only as told by your health care provider. You may need to avoid   aspirin, NSAIDs, or other medicines that increase bleeding.  Eat foods that are high in fiber. This will help keep your stools soft. These foods include whole grains, legumes, fruits, and vegetables. Eating 1-3 prunes each day works well for many people.  Drink enough fluid to keep your urine clear or pale  yellow.  Keep all follow-up visits as told by your health care provider. This is important. Contact a health care provider if:  Your symptoms do not improve. Get help right away if:  Your bleeding increases.  You feel light-headed or you faint.  You feel weak.  You have severe cramps in your back or abdomen.  You pass large blood clots in your stool.  Your symptoms get worse. This information is not intended to replace advice given to you by your health care provider. Make sure you discuss any questions you have with your health care provider. Document Released: 09/13/2015 Document Revised: 10/04/2015 Document Reviewed: 09/13/2015 Elsevier Interactive Patient Education  2017 [employer].   IF you received an x-ray today, you will receive an invoice from [facility]. Please contact [facility] at [phone] with questions or concerns regarding your invoice.   IF you received labwork today, you will receive an invoice from [facility]. Please contact [facility] at [phone] with questions or concerns regarding your invoice.   Our billing staff will not be able to assist you with questions regarding bills from these companies.  You will be contacted with the lab results as soon as they are available. The fastest way to get your results is to activate your My Chart account. Instructions are located on the last page of this paperwork. If you have not heard from [place] regarding the results in 2 weeks, please contact this office.

## 2016-09-10 NOTE — Telephone Encounter (Signed)
We are seeing her in clinic today.  Thanks!  JW

## 2016-09-14 ENCOUNTER — Ambulatory Visit (HOSPITAL_COMMUNITY)
Admission: EM | Admit: 2016-09-14 | Discharge: 2016-09-14 | Disposition: A | Discharge status: Home / Self Care | Payer: 59 | Attending: Internal Medicine | Admitting: Internal Medicine

## 2016-09-14 ENCOUNTER — Encounter (HOSPITAL_COMMUNITY): Payer: Self-pay | Admitting: Emergency Medicine

## 2016-09-14 DIAGNOSIS — R531 Weakness: Secondary | ICD-10-CM | POA: Diagnosis not present

## 2016-09-14 NOTE — Discharge Instructions (Signed)
Keep your scheduled appointment with the gastroenterologist for your endoscopy. Increase the amount of fluids to drink such as water, Gatorade, Pedialyte, juice, etc. if your symptoms persist, follow-up with your primary care provider as needed.

## 2016-09-14 NOTE — ED Triage Notes (Signed)
[facility] with a complaint of "weakness x weeks." The patient reported that she has been seen by her PCP and has been treated for an ulcer.

## 2016-09-14 NOTE — ED Provider Notes (Signed)
CSN: [id number]     Arrival date & time 09/14/16  1728 History   First MD Initiated Contact with Patient 09/14/16 1915     Chief Complaint  Patient presents with  . Weakness   (Consider location/radiation/quality/duration/timing/severity/associated sxs/prior Treatment) 41 year old female with a prior history of GERD presents to clinic for evaluation of a feeling of weakness. She is been to the ER, urgent care, and primary care doctor multiple times over the last month for GI related symptoms, received an extensive workup in the ER as well. States she is had a lightheadedness, and weakness, first noticed after a bowel movement earlier this morning. States she feels much better now, she is drank one bottle of water, 1 diet soda today, 2 bottles yesterday. He has an appointment with GI scheduled on the 24th of this month.   The history is provided by the patient.  Weakness  Pertinent negatives include no vomiting and no headaches.    Past Medical History:  Diagnosis Date  . Dysmenorrhea   . GERD (gastroesophageal reflux disease)    occasionally - med prn  . Moderate obesity   . Pelvic inflammatory disease (PID)   . Preterm labor   . SVD (spontaneous vaginal delivery)    x 3   Past Surgical History:  Procedure Laterality Date  . CHOLECYSTECTOMY    . LAPAROSCOPIC TUBAL LIGATION Bilateral 07/08/2013   Procedure: LAPAROSCOPIC TUBAL LIGATION with Filshie clips;  Surgeon: Turner Daniels, MD;  Location: [facility];  Service: Gynecology;  Laterality: Bilateral;  . WISDOM TOOTH EXTRACTION     general anesthesia   Family History  Problem Relation Age of Onset  . Cancer Mother 84  . Alcohol abuse Father 58    Died of ETOH complications  . Cancer Maternal Grandfather 63   Social History  Substance Use Topics  . Smoking status: Current Some Day Smoker    Packs/day: 0.25    Years: 26.00    Types: Cigarettes  . Smokeless tobacco: Never Used  . Alcohol use 0.0 oz/week     Comment: occasional    OB History    Gravida Para Term Preterm AB Living   5 3 2 1 2 3    SAB TAB Ectopic Multiple Live Births   0 2 0 0 3     Review of Systems  Constitutional: Negative for appetite change, chills and fever.  HENT: Negative.   Respiratory: Negative.   Cardiovascular: Negative.   Gastrointestinal: Negative for abdominal pain, diarrhea and vomiting.  Musculoskeletal: Negative.   Skin: Negative.   Neurological: Positive for weakness. Negative for light-headedness and headaches.    Allergies  Patient has no known allergies.  Home Medications   Prior to Admission medications   Medication Sig Start Date End Date Taking? Authorizing Provider  hydrocortisone (ANUSOL-HC) 25 MG suppository Place 1 suppository (25 mg total) rectally 2 (two) times daily. 09/10/16   Rumley, Bogota N, DO  metroNIDAZOLE (FLAGYL) 500 MG tablet Take 1 tablet (500 mg total) by mouth 2 (two) times daily. 08/26/16   Trena Platt D, PA  pantoprazole (PROTONIX) 40 MG tablet Take 1 tablet (40 mg total) by mouth daily. 05/12/14 09/06/17  Gerhard Munch, MD  pantoprazole (PROTONIX) 40 MG tablet Take 40 mg by mouth daily.    [provider]  pantoprazole (PROTONIX) 40 MG tablet Take 1 tablet (40 mg total) by mouth 2 (two) times daily. X 4 weeks 09/06/16   Tobey Grim, MD   Meds Ordered and Administered  this Visit  Medications - No data to display  BP 125/71 (BP Location: Right Arm)   Pulse 63   Temp 98.5 F (36.9 C) (Oral)   Resp 16   LMP 08/17/2016 (Exact Date)   SpO2 100%  No data found.   Physical Exam  Constitutional: She is oriented to person, place, and time. She appears well-developed and well-nourished. No distress.  HENT:  Head: Normocephalic and atraumatic.  Right Ear: External ear normal.  Left Ear: External ear normal.  Eyes: Conjunctivae are normal.  Neck: Normal range of motion.  Cardiovascular: Normal rate and regular rhythm.   Pulmonary/Chest: Effort normal.  Abdominal:  Soft. Bowel sounds are normal.  Neurological: She is alert and oriented to person, place, and time.  Skin: Skin is warm and dry. Capillary refill takes less than 2 seconds. She is not diaphoretic.  Psychiatric: Her behavior is normal.  Nursing note and vitals reviewed.   Urgent Care Course     Procedures (including critical care time)  Labs Review Labs Reviewed - No data to display  Imaging Review No results found.    MDM   1. Weakness    Symptoms most likely related to dehydration, push fluids, keep appointment with GI specialist, follow-up with primary care as needed, to the ER any time symptoms worsen.    Dorena BodoKennard, Krysta Bloomfield, NP 09/14/16 2041

## 2016-09-16 ENCOUNTER — Telehealth: Payer: Self-pay | Admitting: Physician Assistant

## 2016-09-16 NOTE — Telephone Encounter (Signed)
[facility] OR ENGLISH - Pt being treated for ulcer and is taking protonics twice a day.  She says she has absolutely no energy.  It started last Wednesday or Thursday.  Has short periods of energy, but most of the time she just wants to sleep. Please call asap [phone]

## 2016-09-19 NOTE — Telephone Encounter (Signed)
Since starting protonix feels fatigued, wants to know if she can use a vitamin to help? She actually feels better in her stomach

## 2016-09-19 NOTE — Telephone Encounter (Signed)
She can take a multi-vitamin. She can also, while following a gerd restrictive diet, take just one protonix daily.

## 2016-09-22 NOTE — Telephone Encounter (Signed)
Pt advised.

## 2016-09-29 ENCOUNTER — Ambulatory Visit: Payer: 59 | Admitting: Cardiology

## 2016-09-30 ENCOUNTER — Encounter: Payer: Self-pay | Admitting: *Deleted

## 2016-10-22 ENCOUNTER — Encounter: Payer: Self-pay | Admitting: *Deleted

## 2016-10-22 NOTE — Telephone Encounter (Signed)
-----   Message from Marykay Lexavid W Harding, MD sent at 10/08/2016 10:17 PM EDT ----- Mostly normal rhythm but can go as fast as 177 bpm. Not an arrhythmia. We need to know what was happening at that time. Premature atrial contractions were noted occasionally every other beat when heart rate was going slow. This could be potentially symptomatic, but would not want to over treat.  Easier to discuss in person.  Bryan Lemmaavid Harding, MD

## 2016-10-22 NOTE — Telephone Encounter (Signed)
This encounter was created in error - please disregard.

## 2016-12-22 ENCOUNTER — Other Ambulatory Visit: Payer: Self-pay | Admitting: Family Medicine

## 2017-02-27 ENCOUNTER — Encounter: Payer: Self-pay | Admitting: Physician Assistant

## 2017-02-27 ENCOUNTER — Ambulatory Visit (INDEPENDENT_AMBULATORY_CARE_PROVIDER_SITE_OTHER): Payer: 59 | Admitting: Physician Assistant

## 2017-02-27 VITALS — BP 130/86 | HR 106 | Temp 98.3°F | Resp 16 | Ht 67.0 in | Wt 253.0 lb

## 2017-02-27 DIAGNOSIS — M546 Pain in thoracic spine: Secondary | ICD-10-CM | POA: Diagnosis not present

## 2017-02-27 DIAGNOSIS — K219 Gastro-esophageal reflux disease without esophagitis: Secondary | ICD-10-CM | POA: Diagnosis not present

## 2017-02-27 MED ORDER — CYCLOBENZAPRINE HCL 10 MG PO TABS: 10.0000 mg | ORAL_TABLET | Freq: Three times a day (TID) | ORAL | 0 refills | Status: DC | PRN

## 2017-02-27 MED ORDER — PANTOPRAZOLE SODIUM 40 MG PO TBEC: 40.0000 mg | DELAYED_RELEASE_TABLET | Freq: Every day | ORAL | 6 refills | Status: DC

## 2017-02-27 MED ORDER — MELOXICAM 15 MG PO TABS: 15.0000 mg | ORAL_TABLET | Freq: Every day | ORAL | 1 refills | Status: DC

## 2017-02-27 NOTE — Patient Instructions (Addendum)
Flexeril is a muscle relaxer. Take this as directed. It may make you sleepy  Meloxicam is an antiinflammatory NSAID. Do not use with any other otc pain medication other than tylenol/acetaminophen - so no aleve, ibuprofen, motrin, advil, etc. You may take both of these together.    Apply (moist) heat to the area 3-4 times daily for 20-30 min a time.  Go get a massage.  See below for stretches. Perform stretches when your pain is manageable.  Come back in 2-3 weeks if you are not better.   Cervical Strain and Sprain Rehab Ask your health care provider which exercises are safe for you. Do exercises exactly as told by your health care provider and adjust them as directed. It is normal to feel mild stretching, pulling, tightness, or discomfort as you do these exercises, but you should stop right away if you feel sudden pain or your pain gets worse.Do not begin these exercises until told by your health care provider. Stretching and range of motion exercises These exercises warm up your muscles and joints and improve the movement and flexibility of your neck. These exercises also help to relieve pain, numbness, and tingling. Exercise A: Cervical side bend  1. Using good posture, sit on a stable chair or stand up. 2. Without moving your shoulders, slowly tilt your left / right ear to your shoulder until you feel a stretch in your neck muscles. You should be looking straight ahead. 3. Hold for __________ seconds. 4. Repeat with the other side of your neck. Repeat __________ times. Complete this exercise __________ times a day. Exercise B: Cervical rotation  1. Using good posture, sit on a stable chair or stand up. 2. Slowly turn your head to the side as if you are looking over your left / right shoulder. ? Keep your eyes level with the ground. ? Stop when you feel a stretch along the side and the back of your neck. 3. Hold for __________ seconds. 4. Repeat this by turning to your other  side. Repeat __________ times. Complete this exercise __________ times a day. Exercise C: Thoracic extension and pectoral stretch 1. Roll a towel or a small blanket so it is about 4 inches (10 cm) in diameter. 2. Lie down on your back on a firm surface. 3. Put the towel lengthwise, under your spine in the middle of your back. It should not be not under your shoulder blades. The towel should line up with your spine from your middle back to your lower back. 4. Put your hands behind your head and let your elbows fall out to your sides. 5. Hold for __________ seconds. Repeat __________ times. Complete this exercise __________ times a day. Posture and body mechanics  Body mechanics refers to the movements and positions of your body while you do your daily activities. Posture is part of body mechanics. Good posture and healthy body mechanics can help to relieve stress in your body's tissues and joints. Good posture means that your spine is in its natural S-curve position (your spine is neutral), your shoulders are pulled back slightly, and your head is not tipped forward. The following are general guidelines for applying improved posture and body mechanics to your everyday activities. Standing  When standing, keep your spine neutral and keep your feet about hip-width apart. Keep a slight bend in your knees. Your ears, shoulders, and hips should line up.  When you do a task in which you stand in one place for a long time,  place one foot up on a stable object that is 2-4 inches (5-10 cm) high, such as a footstool. This helps keep your spine neutral. Sitting   When sitting, keep your spine neutral and your keep feet flat on the floor. Use a footrest, if necessary, and keep your thighs parallel to the floor. Avoid rounding your shoulders, and avoid tilting your head forward.  When working at a desk or a computer, keep your desk at a height where your hands are slightly lower than your elbows. Slide your  chair under your desk so you are close enough to maintain good posture.  When working at a computer, place your monitor at a height where you are looking straight ahead and you do not have to tilt your head forward or downward to look at the screen. Resting When lying down and resting, avoid positions that are most painful for you. Try to support your neck in a neutral position. You can use a contour pillow or a small rolled-up towel. Your pillow should support your neck but not push on it. This information is not intended to replace advice given to you by your health care provider. Make sure you discuss any questions you have with your health care provider. Document Released: 04/28/2005 Document Revised: 01/03/2016 Document Reviewed: 04/04/2015 Elsevier Interactive Patient Education  2018 [employer].  IF you received an x-ray today, you will receive an invoice from [facility]. Please contact [facility] at [phone] with questions or concerns regarding your invoice.   IF you received labwork today, you will receive an invoice from [facility]. Please contact [facility] at [phone] with questions or concerns regarding your invoice.   Our billing staff will not be able to assist you with questions regarding bills from these companies.  You will be contacted with the lab results as soon as they are available. The fastest way to get your results is to activate your My Chart account. Instructions are located on the last page of this paperwork. If you have not heard from [place] regarding the results in 2 weeks, please contact this office.

## 2017-02-27 NOTE — Progress Notes (Signed)
[place]  MRN: [id number] DOB: 07/05/1975  PCP: Trena PlattEnglish, Stephanie D, PA  Subjective:  Pt is a 41 year old female who presents to clinic for back pain x 1 day. Started last night.  Yesterday she was changing the bedskirt on her bed by herself, she lifted the mattresses. This morning she c/o pain and tightness of her left back and pectoralis that is worse with movement.  She took a hydrocodone this morning without relief. She used a Scientist, clinical (histocompatibility and immunogenetics)massager which helped some.  Denies flank pain, urinary symptoms, muscle weakness, n/t of extremity.   GERD - Protonix 40 mg qd x several years. She recently had colonoscopy and endoscopy. All negative. Protonix every morning. She avoids onions and green peppers.   Review of Systems  Constitutional: Negative for chills, fatigue and fever.  Respiratory: Negative for cough, shortness of breath and wheezing.   Cardiovascular: Negative for chest pain and palpitations.  Gastrointestinal: Negative for abdominal pain, diarrhea, nausea and vomiting.  Genitourinary: Negative for decreased urine volume, difficulty urinating, dysuria, enuresis, flank pain, frequency, hematuria and urgency.  Musculoskeletal: Positive for back pain and neck pain. Negative for neck stiffness.  Neurological: Negative for dizziness, weakness, light-headedness and headaches.    Patient Active Problem List   Diagnosis Date Noted  . Near syncope 08/27/2016  . Intermittent palpitations 08/27/2016  . Chest pain with low risk for cardiac etiology 08/24/2016  . Dyspnea 08/24/2016  . H. pylori infection 05/30/2015  . GERD (gastroesophageal reflux disease) 05/24/2015    Current Outpatient Prescriptions on File Prior to Visit  Medication Sig Dispense Refill  . pantoprazole (PROTONIX) 40 MG tablet Take 1 tablet (40 mg total) by mouth daily. 30 tablet 0  . pantoprazole (PROTONIX) 40 MG tablet Take 40 mg by mouth daily.    . pantoprazole (PROTONIX) 40 MG tablet Take 1 tablet (40 mg total)  by mouth 2 (two) times daily. X 4 weeks 60 tablet 3  . hydrocortisone (ANUSOL-HC) 25 MG suppository Place 1 suppository (25 mg total) rectally 2 (two) times daily. (Patient not taking: Reported on 02/27/2017) 12 suppository 0  . metroNIDAZOLE (FLAGYL) 500 MG tablet Take 1 tablet (500 mg total) by mouth 2 (two) times daily. (Patient not taking: Reported on 02/27/2017) 14 tablet 0   No current facility-administered medications on file prior to visit.     No Known Allergies   Objective:  BP 130/86   Pulse (!) 106   Temp 98.3 F (36.8 C) (Oral)   Resp 16   Ht 5\' 7"  (1.702 m)   Wt 253 lb (114.8 kg)   LMP 02/20/2017   SpO2 99%   BMI 39.63 kg/m   Physical Exam  Constitutional: She is oriented to person, place, and time and well-developed, well-nourished, and in no distress. No distress.  Musculoskeletal:       Left shoulder: She exhibits tenderness and spasm. She exhibits normal range of motion, no bony tenderness and normal strength.       Cervical back: She exhibits tenderness and spasm. She exhibits normal range of motion and no bony tenderness.  Neurological: She is alert and oriented to person, place, and time. GCS score is 15.  Skin: Skin is warm and dry.  Psychiatric: Mood, memory, affect and judgment normal.  Vitals reviewed.   Assessment and Plan :  1. Acute left-sided thoracic back pain - cyclobenzaprine (FLEXERIL) 10 MG tablet; Take 1 tablet (10 mg total) by mouth 3 (three) times daily as needed for muscle spasms.  Dispense: 30 tablet; Refill: 0 - meloxicam (MOBIC) 15 MG tablet; Take 1 tablet (15 mg total) by mouth daily.  Dispense: 30 tablet; Refill: 1 - Advised heat, hydration, stretching, and massage. RTC in 2-3 weeks if no improvement.  2. Gastroesophageal reflux disease without esophagitis - pantoprazole (PROTONIX) 40 MG tablet; Take 1 tablet (40 mg total) by mouth daily.  Dispense: 30 tablet; Refill: 6   Whitney Roben Tatsch, PA-C  Primary Care at [facility] 02/27/2017 2:22 PM

## 2017-05-03 ENCOUNTER — Other Ambulatory Visit: Payer: Self-pay | Admitting: Physician Assistant

## 2017-05-03 DIAGNOSIS — M546 Pain in thoracic spine: Secondary | ICD-10-CM

## 2017-05-23 ENCOUNTER — Encounter: Payer: Self-pay | Admitting: Urgent Care

## 2017-05-23 ENCOUNTER — Ambulatory Visit: Payer: 59 | Admitting: Urgent Care

## 2017-05-23 ENCOUNTER — Ambulatory Visit (INDEPENDENT_AMBULATORY_CARE_PROVIDER_SITE_OTHER): Payer: 59

## 2017-05-23 ENCOUNTER — Other Ambulatory Visit: Payer: Self-pay

## 2017-05-23 VITALS — BP 110/80 | HR 83 | Temp 98.3°F | Resp 18 | Ht 67.0 in | Wt 270.6 lb

## 2017-05-23 DIAGNOSIS — M546 Pain in thoracic spine: Secondary | ICD-10-CM | POA: Diagnosis not present

## 2017-05-23 DIAGNOSIS — E669 Obesity, unspecified: Secondary | ICD-10-CM | POA: Diagnosis not present

## 2017-05-23 DIAGNOSIS — M7989 Other specified soft tissue disorders: Secondary | ICD-10-CM | POA: Diagnosis not present

## 2017-05-23 DIAGNOSIS — Z87828 Personal history of other (healed) physical injury and trauma: Secondary | ICD-10-CM | POA: Diagnosis not present

## 2017-05-23 DIAGNOSIS — M545 Low back pain, unspecified: Secondary | ICD-10-CM

## 2017-05-23 MED ORDER — CYCLOBENZAPRINE HCL 10 MG PO TABS: 10.0000 mg | ORAL_TABLET | Freq: Three times a day (TID) | ORAL | 5 refills | Status: DC | PRN

## 2017-05-23 MED ORDER — MELOXICAM 15 MG PO TABS: 15.0000 mg | ORAL_TABLET | Freq: Every day | ORAL | 1 refills | Status: DC
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
## 2017-05-23 NOTE — Patient Instructions (Addendum)
Salads - kale, spinach, cabbage, spring mix; use seeds like pumpkin seeds or sunflower seeds; you can also use 1-2 hard boiled eggs. Fruits - avocadoes, berries (blueberries, raspberries, blackberries), apples, oranges, pomegranate, grapefruit Seeds - quinoa, chia seeds; you can also incorporate oatmeal Vegetables - aspargus, cauliflower, broccoli, green beans, brussel spouts, bell peppers; stay away from starchy vegetables like potatoes, carrots, peas  Brussel sprouts - Cut off stems. Place in a mixing bowl that has a lid. Pour in a 1/4-1/2 cup olive oil, spices, use a light amount of parmesan. Place on a baking sheet. Bake for 10 minutes at 400F. Take it out, eat the brussel chips. Place for another 5-10 minutes.   Mashed cauliflower - Boil a bunch of cauliflower in a pot of water. Blend in a food processor with 1-2 tablespoons of butter.  Spaghetti squash -  Cut the squash in half very carefully, clean out seeds from the middle. Place 1/2 face down in a microwave safe dish with at least 2 inches of water. Make 4-6 slits on outside of spaghetti squash and microwave for 10-12 minutes. Take out the spaghetti using a metal spoon. Repeat for the other half.   Vega protein is good protein powder, make sure you use ~6 ice cubes to give it smoothie consistency together with ~4-6 ounces of vanilla soy milk. Throw cinnamon into your shake, use peanut butter. You can also use the fruits that I listed above. Throw spinach or kale into the shake.      IF you received an x-ray today, you will receive an invoice from [facility]. Please contact [facility] at [phone] with questions or concerns regarding your invoice.   IF you received labwork today, you will receive an invoice from [facility]. Please contact [facility] at [phone] with questions or concerns regarding your invoice.   Our billing staff will not be able to assist you with questions regarding bills from these  companies.  You will be contacted with the lab results as soon as they are available. The fastest way to get your results is to activate your My Chart account. Instructions are located on the last page of this paperwork. If you have not heard from us regarding the results in 2 weeks, please contact this office.      

## 2017-05-24 LAB — COMPREHENSIVE METABOLIC PANEL
ALK PHOS: 75 IU/L (ref 39–117)
ALT: 24 IU/L (ref 0–32)
AST: 20 IU/L (ref 0–40)
Albumin/Globulin Ratio: 1.3 (ref 1.2–2.2)
Albumin: 3.9 g/dL (ref 3.5–5.5)
BUN/Creatinine Ratio: 11 (ref 9–23)
BUN: 7 mg/dL (ref 6–24)
Bilirubin Total: 0.3 mg/dL (ref 0.0–1.2)
CALCIUM: 9 mg/dL (ref 8.7–10.2)
CO2: 28 mmol/L (ref 20–29)
CREATININE: 0.61 mg/dL (ref 0.57–1.00)
Chloride: 104 mmol/L (ref 96–106)
GFR calc Af Amer: 128 mL/min/{1.73_m2} (ref >59)
GFR, EST NON AFRICAN AMERICAN: 111 mL/min/{1.73_m2} (ref >59)
Globulin, Total: 2.9 g/dL (ref 1.5–4.5)
Glucose: 77 mg/dL (ref 65–99)
POTASSIUM: 4.7 mmol/L (ref 3.5–5.2)
Sodium: 140 mmol/L (ref 134–144)
Total Protein: 6.8 g/dL (ref 6.0–8.5)

## 2017-05-24 LAB — SEDIMENTATION RATE: Sed Rate: 19 mm/hr (ref 0–32)

## 2017-08-19 ENCOUNTER — Encounter: Payer: Self-pay | Admitting: Physician Assistant

## 2017-08-26 ENCOUNTER — Other Ambulatory Visit: Payer: Self-pay

## 2017-08-26 ENCOUNTER — Encounter: Payer: Self-pay | Admitting: Family Medicine

## 2017-08-26 ENCOUNTER — Ambulatory Visit: Payer: 59 | Admitting: Family Medicine

## 2017-08-26 VITALS — BP 126/79 | HR 112 | Temp 98.9°F | Resp 17 | Ht 67.0 in | Wt 253.6 lb

## 2017-08-26 DIAGNOSIS — K219 Gastro-esophageal reflux disease without esophagitis: Secondary | ICD-10-CM | POA: Diagnosis not present

## 2017-08-26 DIAGNOSIS — F17200 Nicotine dependence, unspecified, uncomplicated: Secondary | ICD-10-CM

## 2017-08-26 MED ORDER — NICOTINE 14 MG/24HR TD PT24: 14.0000 mg | MEDICATED_PATCH | Freq: Every day | TRANSDERMAL | 0 refills | Status: AC

## 2017-08-26 MED ORDER — RANITIDINE HCL 150 MG PO TABS: 150.0000 mg | ORAL_TABLET | Freq: Two times a day (BID) | ORAL | 3 refills | Status: AC

## 2017-08-26 NOTE — Patient Instructions (Addendum)
Reflux  Zantac twice a day Continue the protonix once a day Quit smoking    Follow up with Dr. Evette CristalGanem Works at: 1. [facility] [address], [address] [place], [place] [address] Call: [phone]   IF you received an x-ray today, you will receive an invoice from [facility]. Please contact [facility] at [phone] with questions or concerns regarding your invoice.   IF you received labwork today, you will receive an invoice from [facility]. Please contact [facility] at [phone] with questions or concerns regarding your invoice.   Our billing staff will not be able to assist you with questions regarding bills from these companies.  You will be contacted with the lab results as soon as they are available. The fastest way to get your results is to activate your My Chart account. Instructions are located on the last page of this paperwork. If you have not heard from us regarding the results in 2 weeks, please contact this office.     Indigestion Indigestion is a feeling of pain, discomfort, burning, or fullness in the upper part of your abdomen. It can come and go. It may occur frequently or rarely. Indigestion tends to occur while you are eating or right after you have finished eating. It may be worse at night and while bending over or lying down. Follow these instructions at home: Take these actions to decrease your pain or discomfort and to help avoid complications. Diet  Follow a diet as recommended by your health care provider. This may involve avoiding foods and drinks such as: ? Coffee and tea (with or without caffeine). ? Drinks that contain alcohol. ? Energy drinks and sports drinks. ? Carbonated drinks or sodas. ? Chocolate and cocoa. ? Peppermint and mint flavorings. ? Garlic and onions. ? Horseradish. ? Spicy and acidic foods, including peppers, chili powder, curry powder, vinegar, hot sauces, and barbecue sauce. ? Citrus fruit juices  and citrus fruits, such as oranges, lemons, and limes. ? Tomato-based foods, such as red sauce, chili, salsa, and pizza with red sauce. ? Fried and fatty foods, such as donuts, french fries, potato chips, and high-fat dressings. ? High-fat meats, such as hot dogs and fatty cuts of red and white meats, such as rib eye steak, sausage, ham, and bacon. ? High-fat dairy items, such as whole milk, butter, and cream cheese.  Eat small, frequent meals instead of large meals.  Avoid drinking large amounts of liquid with your meals.  Avoid eating meals during the 2-3 hours before bedtime.  Avoid lying down right after you eat.  Do not exercise right after you eat. General instructions  Pay attention to any changes in your symptoms.  Take over-the-counter and prescription medicines only as told by your health care provider. Do not take aspirin, ibuprofen, or other NSAIDs unless your health care provider told you to do so.  Do not use any tobacco products, including cigarettes, chewing tobacco, and e-cigarettes. If you need help quitting, ask your health care provider.  Wear loose-fitting clothing. Do not wear anything tight around your waist that causes pressure on your abdomen.  Raise (elevate) the head of your bed about 6 inches (15 cm).  Try to reduce your stress, such as with yoga or meditation. If you need help reducing stress, ask your health care provider.  If you are overweight, reduce your weight to an amount that is healthy for you. Ask your health care provider for guidance about a safe weight loss goal.  Keep  all follow-up visits as told by your health care provider. This is important. Contact a health care provider if:  You have new symptoms.  You have unexplained weight loss.  You have difficulty swallowing, or it hurts to swallow.  Your symptoms do not improve with treatment.  Your symptoms last for more than two days.  You have a fever.  You vomit. Get help right  away if:  You have pain in your arms, neck, jaw, teeth, or back.  You feel sweaty, dizzy, or light-headed.  You faint.  You have chest pain or shortness of breath.  You cannot stop vomiting, or you vomit blood.  Your stool is bloody or black.  You have severe pain in your abdomen. This information is not intended to replace advice given to you by your health care provider. Make sure you discuss any questions you have with your health care provider. Document Released: 06/05/2004 Document Revised: 10/04/2015 Document Reviewed: 08/23/2014 Elsevier Interactive Patient Education  [employer].

## 2017-08-26 NOTE — Progress Notes (Signed)
Chief Complaint  Patient presents with  . digestive issues    x 3 weeks, no constipation, diarrhea, no pain but churning feeling in belly.  Feels like something stuck in throat, ate at [employer] before and the same symptoms happened.  Per pt had chickeh with lettuce, mayo, bread and oil and vinegar.  ? gluten free, ? bread she ate.    HPI  Patient has chronic history of GI issues Pt is s/p EGD and colonoscopy with GI  She reports that she has been having sensation of racing heart She reports that it happened a year ago  She reports that she ate [employer] a year ago and had the same episode She states that this episode started 3 weeks ago after [employer] with chicken philly with lettuce mayonnaise, oil and vinegar, bread She states that there were onions She states that she has a sensation of upset stomach and though she is not nauseous she feels like vomiting would help her She feels like she is having churning in her stomach and cannot seems to alleviate it  Being hungry or eating seems to have the same results It has been improving slightly since yesterday  She is taking protonix 40mg  and zantac. She takes the zantac occasionally if she has acid reflux coming up her throat.  Wt Readings from Last 3 Encounters:  08/26/17 253 lb 9.6 oz (115 kg)  05/23/17 270 lb 9.6 oz (122.7 kg)  02/27/17 253 lb (114.8 kg)   Tobacco use She smokes 1/2 pack a day and has been smoking for 25 years The longest she has ever quit? Never She smokes in th e car and on the way to work She denies morning cough sometimes but reports that she thinks it is gerd and a cold   Past Medical History:  Diagnosis Date  . Dysmenorrhea   . GERD (gastroesophageal reflux disease)    occasionally - med prn  . Moderate obesity   . Pelvic inflammatory disease (PID)   . Preterm labor   . SVD (spontaneous vaginal delivery)    x 3    Current Outpatient Medications  Medication Sig Dispense Refill  .  pantoprazole (PROTONIX) 40 MG tablet Take 1 tablet (40 mg total) by mouth daily. 30 tablet 6  . nicotine (NICODERM CQ - DOSED IN MG/24 HOURS) 14 mg/24hr patch Place 1 patch (14 mg total) onto the skin daily. 28 patch 0  . pantoprazole (PROTONIX) 40 MG tablet Take 40 mg by mouth daily.    . ranitidine (ZANTAC) 150 MG tablet Take 1 tablet (150 mg total) by mouth 2 (two) times daily. 60 tablet 3   No current facility-administered medications for this visit.     Allergies: No Known Allergies  Past Surgical History:  Procedure Laterality Date  . CHOLECYSTECTOMY    . LAPAROSCOPIC TUBAL LIGATION Bilateral 07/08/2013   Procedure: LAPAROSCOPIC TUBAL LIGATION with Filshie clips;  Surgeon: Turner Danielsavid C Lowe, MD;  Location: [facility];  Service: Gynecology;  Laterality: Bilateral;  . WISDOM TOOTH EXTRACTION     general anesthesia    Social History   Socioeconomic History  . Marital status: Single    Spouse name: Not on file  . Number of children: 3  . Years of education: 6616  . Highest education level: Not on file  Occupational History  . Occupation: Research officer, political partyrder processor    Employer: [employer]  Social Needs  . Financial resource strain: Not on file  . Food insecurity:  Worry: Not on file    Inability: Not on file  . Transportation needs:    Medical: Not on file    Non-medical: Not on file  Tobacco Use  . Smoking status: Current Some Day Smoker    Packs/day: 0.25    Years: 26.00    Pack years: 6.50    Types: Cigarettes  . Smokeless tobacco: Never Used  Substance and Sexual Activity  . Alcohol use: Yes    Alcohol/week: 0.0 oz    Comment: occasional  . Drug use: No  . Sexual activity: Yes    Birth control/protection: Surgical  Lifestyle  . Physical activity:    Days per week: Not on file    Minutes per session: Not on file  . Stress: Not on file  Relationships  . Social connections:    Talks on phone: Not on file    Gets together: Not on file    Attends religious service: Not on  file    Active member of club or organization: Not on file    Attends meetings of clubs or organizations: Not on file    Relationship status: Not on file  Other Topics Concern  . Not on file  Social History Narrative   She does try to exercise regularly doing cardio and weights for maybe 30 minutes at a time 3 days a week.    Family History  Problem Relation Age of Onset  . Cancer Mother 78  . Alcohol abuse Father 15       Died of ETOH complications  . Cancer Maternal Grandfather 65     ROS Review of Systems See HPI Constitution: No fevers or chills No malaise No diaphoresis Skin: No rash or itching Eyes: no blurry vision, no double vision GU: no dysuria or hematuria Neuro: no dizziness or headaches all others reviewed and negative   Objective: Vitals:   08/26/17 1634  BP: 126/79  Pulse: (!) 112  Resp: 17  Temp: 98.9 F (37.2 C)  TempSrc: Oral  SpO2: 98%  Weight: 253 lb 9.6 oz (115 kg)  Height: 5\' 7"  (1.702 m)    Physical Exam  Constitutional: She is oriented to person, place, and time. She appears well-developed and well-nourished.  HENT:  Head: Normocephalic and atraumatic.  Eyes: Conjunctivae and EOM are normal.  Cardiovascular: Normal rate, regular rhythm and normal heart sounds.  No murmur heard. Pulmonary/Chest: Effort normal and breath sounds normal. No stridor. No respiratory distress.  Abdominal: Soft. Bowel sounds are normal. She exhibits no distension and no mass. There is no tenderness. There is no rebound and no guarding. No hernia.  Neurological: She is alert and oriented to person, place, and time.  Skin: Skin is warm. Capillary refill takes less than 2 seconds.  Psychiatric: She has a normal mood and affect. Her behavior is normal. Judgment and thought content normal.      Assessment and Plan Latoya Brooks was seen today for digestive issues.  Diagnoses and all orders for this visit:  Gastroesophageal reflux disease without  esophagitis  Tobacco use disorder -     nicotine (NICODERM CQ - DOSED IN MG/24 HOURS) 14 mg/24hr patch; Place 1 patch (14 mg total) onto the skin daily.  Other orders -     ranitidine (ZANTAC) 150 MG tablet; Take 1 tablet (150 mg total) by mouth 2 (two) times daily.   Stop smoking so the esophagus can heal Increase zantac to bid Continue protonix Continue weight loss Follow up with Dr. Evette Cristal  A total of 30 minutes were spent face-to-face with the patient during this encounter and over half of that time was spent on counseling and coordination of care.   [place]ers

## 2017-08-28 ENCOUNTER — Other Ambulatory Visit: Payer: Self-pay | Admitting: Physician Assistant

## 2017-08-28 DIAGNOSIS — K219 Gastro-esophageal reflux disease without esophagitis: Secondary | ICD-10-CM

## 2017-09-16 ENCOUNTER — Ambulatory Visit: Payer: 59 | Admitting: Family Medicine

## 2017-09-16 ENCOUNTER — Encounter: Payer: Self-pay | Admitting: Family Medicine

## 2017-09-16 VITALS — BP 139/92 | HR 95 | Temp 98.6°F | Ht 67.13 in | Wt 254.4 lb

## 2017-09-16 DIAGNOSIS — F17200 Nicotine dependence, unspecified, uncomplicated: Secondary | ICD-10-CM

## 2017-09-16 DIAGNOSIS — R1915 Other abnormal bowel sounds: Secondary | ICD-10-CM | POA: Diagnosis not present

## 2017-09-16 DIAGNOSIS — Z6839 Body mass index (BMI) 39.0-39.9, adult: Secondary | ICD-10-CM

## 2017-09-16 DIAGNOSIS — E6609 Other obesity due to excess calories: Secondary | ICD-10-CM

## 2017-09-16 DIAGNOSIS — K219 Gastro-esophageal reflux disease without esophagitis: Secondary | ICD-10-CM

## 2017-09-16 DIAGNOSIS — Z716 Tobacco abuse counseling: Secondary | ICD-10-CM

## 2017-09-16 MED ORDER — VARENICLINE TARTRATE 0.5 MG X 11 & 1 MG X 42 PO MISC
ORAL | 0 refills | Status: AC
Start: 2017-09-16 — End: ?

## 2017-09-16 NOTE — Progress Notes (Signed)
Chief Complaint  Patient presents with  . Nicotine Dependence    smoking cessation    HPI  GERD Patient reports that she has been noticing less bowel sounds and feels like her belly is "quiet" She states that since she has changed her foods and avoiding triggers  She has a 42yo and does not have any child care at the gym She is on protonix daily and zantac prn  Tobacco abuse  She reports that she knows she needs to and has the patches She states that she just have to "get her mind right" She states that she has never quit before  She smokes in the car, after meals and when she is stressed She has patches at home She smokes 1/2 pack a day for the past 25 years  Obesity  Body mass index is 39.7 kg/m.  Wt Readings from Last 3 Encounters:  09/16/17 254 lb 6.4 oz (115.4 kg)  08/26/17 253 lb 9.6 oz (115 kg)  05/23/17 270 lb 9.6 oz (122.7 kg)   She reports that she has not been exercising She is trying to eat better She does not walk after work She denies chest pain, palpitations, sob, claudication  Past Medical History:  Diagnosis Date  . Dysmenorrhea   . GERD (gastroesophageal reflux disease)    occasionally - med prn  . Moderate obesity   . Pelvic inflammatory disease (PID)   . Preterm labor   . SVD (spontaneous vaginal delivery)    x 3    Current Outpatient Medications  Medication Sig Dispense Refill  . nicotine (NICODERM CQ - DOSED IN MG/24 HOURS) 14 mg/24hr patch Place 1 patch (14 mg total) onto the skin daily. 28 patch 0  . pantoprazole (PROTONIX) 40 MG tablet Take 40 mg by mouth daily.    . pantoprazole (PROTONIX) 40 MG tablet TAKE 1 TABLET BY MOUTH EVERY DAY 30 tablet 5  . ranitidine (ZANTAC) 150 MG tablet Take 1 tablet (150 mg total) by mouth 2 (two) times daily. 60 tablet 3  . varenicline (CHANTIX STARTING MONTH PAK) 0.5 MG X 11 & 1 MG X 42 tablet Take one 0.5 mg tablet by mouth once daily for 3 days, then increase to one 0.5 mg tablet twice daily for 4  days, then increase to one 1 mg tablet twice daily. 53 tablet 0   No current facility-administered medications for this visit.     Allergies: No Known Allergies  Past Surgical History:  Procedure Laterality Date  . CHOLECYSTECTOMY    . LAPAROSCOPIC TUBAL LIGATION Bilateral 07/08/2013   Procedure: LAPAROSCOPIC TUBAL LIGATION with Filshie clips;  Surgeon: Turner Daniels, MD;  Location: [facility];  Service: Gynecology;  Laterality: Bilateral;  . WISDOM TOOTH EXTRACTION     general anesthesia    Social History   Socioeconomic History  . Marital status: Single    Spouse name: Not on file  . Number of children: 3  . Years of education: 39  . Highest education level: Not on file  Occupational History  . Occupation: Research officer, political party: [employer]  Social Needs  . Financial resource strain: Not on file  . Food insecurity:    Worry: Not on file    Inability: Not on file  . Transportation needs:    Medical: Not on file    Non-medical: Not on file  Tobacco Use  . Smoking status: Current Some Day Smoker    Packs/day: 0.25    Years: 26.00  Pack years: 6.50    Types: Cigarettes  . Smokeless tobacco: Never Used  Substance and Sexual Activity  . Alcohol use: Yes    Alcohol/week: 0.0 oz    Comment: occasional  . Drug use: No  . Sexual activity: Yes    Birth control/protection: Surgical  Lifestyle  . Physical activity:    Days per week: Not on file    Minutes per session: Not on file  . Stress: Not on file  Relationships  . Social connections:    Talks on phone: Not on file    Gets together: Not on file    Attends religious service: Not on file    Active member of club or organization: Not on file    Attends meetings of clubs or organizations: Not on file    Relationship status: Not on file  Other Topics Concern  . Not on file  Social History Narrative   She does try to exercise regularly doing cardio and weights for maybe 30 minutes at a time 3 days a week.     Family History  Problem Relation Age of Onset  . Cancer Mother 42  . Alcohol abuse Father 61       Died of ETOH complications  . Cancer Maternal Grandfather 65     ROS Review of Systems See HPI Constitution: No fevers or chills No malaise No diaphoresis Skin: No rash or itching Eyes: no blurry vision, no double vision GU: no dysuria or hematuria Neuro: no dizziness or headaches all others reviewed and negative   Objective: Vitals:   09/16/17 1405  BP: (!) 139/92  Pulse: 95  Temp: 98.6 F (37 C)  TempSrc: Oral  SpO2: 96%  Weight: 254 lb 6.4 oz (115.4 kg)  Height: 5' 7.13" (1.705 m)    Physical Exam  Constitutional: She is oriented to person, place, and time. She appears well-developed and well-nourished.  HENT:  Head: Normocephalic and atraumatic.  Eyes: Conjunctivae and EOM are normal.  Neck: Normal range of motion. Neck supple.  Cardiovascular: Normal rate, regular rhythm and normal heart sounds.  No murmur heard. Pulmonary/Chest: Effort normal and breath sounds normal. No stridor. No respiratory distress.  Abdominal: Soft. Bowel sounds are normal. She exhibits no distension and no mass. There is no tenderness. There is no rebound and no guarding. No hernia.  Obese abdomen  Musculoskeletal: Normal range of motion. She exhibits no edema.  Neurological: She is alert and oriented to person, place, and time.  Psychiatric: She has a normal mood and affect. Her behavior is normal. Judgment and thought content normal.    Assessment and Plan Latoya Brooks was seen today for nicotine dependence.  Diagnoses and all orders for this visit:  Gastroesophageal reflux disease without esophagitis - discussed probiotics, advised increased exercise and hydration, discussed smoking cessation and weight loss -     Ambulatory referral to Gastroenterology  Decreased bowel sounds- advised increased hydration, walking and probiotics  Encounter for smoking cessation  counseling Tobacco use disorder - discussed ways to quit smoking including gum, patch, chantix, hypnosis  Pt will do chantix starter pack  Obesity - discussed exercise  Other orders -     varenicline (CHANTIX STARTING MONTH PAK) 0.5 MG X 11 & 1 MG X 42 tablet; Take one 0.5 mg tablet by mouth once daily for 3 days, then increase to one 0.5 mg tablet twice daily for 4 days, then increase to one 1 mg tablet twice daily.     Larraine Argo A  Nolon Rod

## 2017-09-16 NOTE — Patient Instructions (Addendum)
Try Align or Culturelle Digestive Health Probiotic  Probiotics What are probiotics? Probiotics are the good bacteria and yeasts that live in your body and keep you and your digestive system healthy. Probiotics also help your body's defense (immune) system and protect your body against bad bacterial growth. Certain foods contain probiotics, such as yogurt. Probiotics can also be purchased as a supplement. As with any supplement or drug, it is important to discuss its use with your health care provider. What affects the balance of bacteria in my body? The balance of bacteria in your body can be affected by:  Antibiotic medicines. Antibiotics are sometimes necessary to treat infection. Unfortunately, they may kill good or friendly bacteria in your body as well as the bad bacteria. This may lead to stomach problems like diarrhea, gas, and cramping.  Disease. Some conditions are the result of an overgrowth of bad bacteria, yeasts, parasites, or fungi. These conditions include: ? Infectious diarrhea. ? Stomach and respiratory infections. ? Skin infections. ? Irritable bowel syndrome (IBS). ? Inflammatory bowel diseases. ? Ulcer due to Helicobacter pylori (H. pylori) infection. ? Tooth decay and periodontal disease. ? Vaginal infections.  Stress and poor diet may also lower the good bacteria in your body. What type of probiotic is right for me? Probiotics are available over the counter at your local pharmacy, health food, or grocery store. They come in many different forms, combinations of strains, and dosing strengths. Some may need to be refrigerated. Always read the label for storage and usage instructions. Specific strains have been shown to be more effective for certain conditions. Ask your health care provider what option is best for you. Why would I need probiotics? There are many reasons your health care provider might recommend a probiotic supplement,  including:  Diarrhea.  Constipation.  IBS.  Respiratory infections.  Yeast infections.  Acne, eczema, and other skin conditions.  Frequent urinary tract infections (UTIs).  Are there side effects of probiotics? Some people experience mild side effects when taking probiotics. Side effects are usually temporary and may include:  Gas.  Bloating.  Cramping.  Rarely, serious side effects, such as infection or immune system changes, may occur. What else do I need to know about probiotics?  There are many different strains of probiotics. Certain strains may be more effective depending on your condition. Probiotics are available in varying doses. Ask your health care provider which probiotic you should use and how often.  If you are taking probiotics along with antibiotics, it is generally recommended to wait at least 2 hours between taking the antibiotic and taking the probiotic. For more information: [facility] [email or web address].[email or web address]/ This information is not intended to replace advice given to you by your health care provider. Make sure you discuss any questions you have with your health care provider. Document Released: 11/23/2013 Document Revised: 03/25/2016 Document Reviewed: 07/26/2013 Elsevier Interactive Patient Education  2017 [employer]  Coping with Quitting Smoking Quitting smoking is a physical and mental challenge. You will face cravings, withdrawal symptoms, and temptation. Before quitting, work with your health care provider to make a plan that can help you cope. Preparation can help you quit and keep you from giving in. How can I cope with cravings? Cravings usually last for 5-10 minutes. If you get through it, the craving will pass. Consider taking the following actions to help you cope with cravings:  Keep your mouth busy: ? Chew sugar-free gum. ? Suck on hard candies or a  straw. ? Brush your teeth.  Keep your  hands and body busy: ? Immediately change to a different activity when you feel a craving. ? Squeeze or play with a ball. ? Do an activity or a hobby, like making bead jewelry, practicing needlepoint, or working with wood. ? Mix up your normal routine. ? Take a short exercise break. Go for a quick walk or run up and down stairs. ? Spend time in public places where smoking is not allowed.  Focus on doing something kind or helpful for someone else.  Call a friend or family member to talk during a craving.  Join a support group.  Call a quit line, such as 1-800-QUIT-NOW.  Talk with your health care provider about medicines that might help you cope with cravings and make quitting easier for you.  To manage withdrawal symptoms:  Avoid places, people, and activities that trigger your cravings.  Remember why you want to quit.  Get plenty of sleep.  Avoid coffee and other caffeinated drinks. These may worsen some of your symptoms.  What are some ways I can prevent weight gain? Be aware that many people gain weight after they quit smoking. However, not everyone does. To keep from gaining weight, have a plan in place before you quit and stick to the plan after you quit. Your plan should include:  Having healthy snacks. When you have a craving, it may help to: ? Eat plain popcorn, crunchy carrots, celery, or other cut vegetables. ? Chew sugar-free gum.  Changing how you eat: ? Eat small portion sizes at meals. ? Eat 4-6 small meals throughout the day instead of 1-2 large meals a day. ? Be mindful when you eat. Do not watch television or do other things that might distract you as you eat.  Exercising regularly: ? Make time to exercise each day. If you do not have time for a long workout, do short bouts of exercise for 5-10 minutes several times a day. ? Do some form of strengthening exercise, like weight lifting, and some form of aerobic exercise, like running or swimming.  Drinking  plenty of water or other low-calorie or no-calorie drinks. Drink 6-8 glasses of water daily, or as much as instructed by your health care provider.  Summary  Quitting smoking is a physical and mental challenge. You will face cravings, withdrawal symptoms, and temptation to smoke again. Preparation can help you as you go through these challenges.  You can cope with cravings by keeping your mouth busy (such as by chewing gum), keeping your body and hands busy, and making calls to family, friends, or a helpline for people who want to quit smoking.  You can cope with withdrawal symptoms by avoiding places where people smoke, avoiding drinks with caffeine, and getting plenty of rest.  Ask your health care provider about the different ways to prevent weight gain, avoid stress, and handle social situations. This information is not intended to replace advice given to you by your health care provider. Make sure you discuss any questions you have with your health care provider. Document Released: 04/25/2016 Document Revised: 04/25/2016 Document Reviewed: 04/25/2016 Elsevier Interactive Patient Education  [employer].

## 2017-10-19 ENCOUNTER — Other Ambulatory Visit: Payer: Self-pay

## 2017-10-19 ENCOUNTER — Encounter: Payer: Self-pay | Admitting: Family Medicine

## 2017-10-19 ENCOUNTER — Ambulatory Visit: Payer: Self-pay | Admitting: Family Medicine

## 2017-10-19 DIAGNOSIS — Z131 Encounter for screening for diabetes mellitus: Secondary | ICD-10-CM

## 2017-10-19 DIAGNOSIS — K219 Gastro-esophageal reflux disease without esophagitis: Secondary | ICD-10-CM

## 2017-10-19 DIAGNOSIS — F17200 Nicotine dependence, unspecified, uncomplicated: Secondary | ICD-10-CM

## 2017-10-19 LAB — POCT GLYCOSYLATED HEMOGLOBIN (HGB A1C): Hemoglobin A1C: 5.6 % (ref 4.0–5.6)

## 2017-10-19 NOTE — Patient Instructions (Addendum)
   IF you received an x-ray today, you will receive an invoice from [facility]. Please contact [facility] at [phone] with questions or concerns regarding your invoice.   IF you received labwork today, you will receive an invoice from [facility]. Please contact [facility] at [phone] with questions or concerns regarding your invoice.   Our billing staff will not be able to assist you with questions regarding bills from these companies.  You will be contacted with the lab results as soon as they are available. The fastest way to get your results is to activate your My Chart account. Instructions are located on the last page of this paperwork. If you have not heard from us regarding the results in 2 weeks, please contact this office.     Coping with Quitting Smoking Quitting smoking is a physical and mental challenge. You will face cravings, withdrawal symptoms, and temptation. Before quitting, work with your health care provider to make a plan that can help you cope. Preparation can help you quit and keep you from giving in. How can I cope with cravings? Cravings usually last for 5-10 minutes. If you get through it, the craving will pass. Consider taking the following actions to help you cope with cravings:  Keep your mouth busy: ? Chew sugar-free gum. ? Suck on hard candies or a straw. ? Brush your teeth.  Keep your hands and body busy: ? Immediately change to a different activity when you feel a craving. ? Squeeze or play with a ball. ? Do an activity or a hobby, like making bead jewelry, practicing needlepoint, or working with wood. ? Mix up your normal routine. ? Take a short exercise break. Go for a quick walk or run up and down stairs. ? Spend time in public places where smoking is not allowed.  Focus on doing something kind or helpful for someone else.  Call a friend or family member to talk during a craving.  Join a support group.  Call a quit  line, such as 1-800-QUIT-NOW.  Talk with your health care provider about medicines that might help you cope with cravings and make quitting easier for you.  How can I deal with withdrawal symptoms? Your body may experience negative effects as it tries to get used to not having nicotine in the system. These effects are called withdrawal symptoms. They may include:  Feeling hungrier than normal.  Trouble concentrating.  Irritability.  Trouble sleeping.  Feeling depressed.  Restlessness and agitation.  Craving a cigarette.  To manage withdrawal symptoms:  Avoid places, people, and activities that trigger your cravings.  Remember why you want to quit.  Get plenty of sleep.  Avoid coffee and other caffeinated drinks. These may worsen some of your symptoms.  How can I handle social situations? Social situations can be difficult when you are quitting smoking, especially in the first few weeks. To manage this, you can:  Avoid parties, bars, and other social situations where people might be smoking.  Avoid alcohol.  Leave right away if you have the urge to smoke.  Explain to your family and friends that you are quitting smoking. Ask for understanding and support.  Plan activities with friends or family where smoking is not an option.  What are some ways I can cope with stress? Wanting to smoke may cause stress, and stress can make you want to smoke. Find ways to manage your stress. Relaxation techniques can help. For example:  Breathe slowly and deeply, in   through your nose and out through your mouth.  Listen to soothing, relaxing music.  Talk with a family member or friend about your stress.  Light a candle.  Soak in a bath or take a shower.  Think about a peaceful place.  What are some ways I can prevent weight gain? Be aware that many people gain weight after they quit smoking. However, not everyone does. To keep from gaining weight, have a plan in place before  you quit and stick to the plan after you quit. Your plan should include:  Having healthy snacks. When you have a craving, it may help to: ? Eat plain popcorn, crunchy carrots, celery, or other cut vegetables. ? Chew sugar-free gum.  Changing how you eat: ? Eat small portion sizes at meals. ? Eat 4-6 small meals throughout the day instead of 1-2 large meals a day. ? Be mindful when you eat. Do not watch television or do other things that might distract you as you eat.  Exercising regularly: ? Make time to exercise each day. If you do not have time for a long workout, do short bouts of exercise for 5-10 minutes several times a day. ? Do some form of strengthening exercise, like weight lifting, and some form of aerobic exercise, like running or swimming.  Drinking plenty of water or other low-calorie or no-calorie drinks. Drink 6-8 glasses of water daily, or as much as instructed by your health care provider.  Summary  Quitting smoking is a physical and mental challenge. You will face cravings, withdrawal symptoms, and temptation to smoke again. Preparation can help you as you go through these challenges.  You can cope with cravings by keeping your mouth busy (such as by chewing gum), keeping your body and hands busy, and making calls to family, friends, or a helpline for people who want to quit smoking.  You can cope with withdrawal symptoms by avoiding places where people smoke, avoiding drinks with caffeine, and getting plenty of rest.  Ask your health care provider about the different ways to prevent weight gain, avoid stress, and handle social situations. This information is not intended to replace advice given to you by your health care provider. Make sure you discuss any questions you have with your health care provider. Document Released: 04/25/2016 Document Revised: 04/25/2016 Document Reviewed: 04/25/2016 Elsevier Interactive Patient Education  2018 [employer]  

## 2017-10-19 NOTE — Progress Notes (Signed)
Chief Complaint  Patient presents with  . Follow-up    GERD, stomach is discomfort is controlled if food selection is considered    HPI  Tobacco Use She reports that she had her first cigarette at age 42 and took a smoke off one of her mothers She started smoking regularly at age 42 She reports that she did not start chantix because she was concerned about the side effects She has not cut down on the number of cigarettes  GERD She states that she has changed her foods and is noticing her pain is better  She states that she also took L[employer], [employer] probiotic She reports that she was wondering if she has yeast infection from align  Yeast infection She noticed that she had a yeast infection from Align She reports that she   Wt Readings from Last 3 Encounters:  10/19/17 254 lb 8 oz (115.4 kg)  09/16/17 254 lb 6.4 oz (115.4 kg)  08/26/17 253 lb 9.6 oz (115 kg)   Body mass index is 41.08 kg/m.   4 review of systems  Past Medical History:  Diagnosis Date  . Dysmenorrhea   . GERD (gastroesophageal reflux disease)    occasionally - med prn  . Moderate obesity   . Pelvic inflammatory disease (PID)   . Preterm labor   . SVD (spontaneous vaginal delivery)    x 3    Current Outpatient Medications  Medication Sig Dispense Refill  . nicotine (NICODERM CQ - DOSED IN MG/24 HOURS) 14 mg/24hr patch Place 1 patch (14 mg total) onto the skin daily. 28 patch 0  . pantoprazole (PROTONIX) 40 MG tablet Take 40 mg by mouth daily.    . pantoprazole (PROTONIX) 40 MG tablet TAKE 1 TABLET BY MOUTH EVERY DAY 30 tablet 5  . ranitidine (ZANTAC) 150 MG tablet Take 1 tablet (150 mg total) by mouth 2 (two) times daily. 60 tablet 3  . varenicline (CHANTIX STARTING MONTH PAK) 0.5 MG X 11 & 1 MG X 42 tablet Take one 0.5 mg tablet by mouth once daily for 3 days, then increase to one 0.5 mg tablet twice daily for 4 days, then increase to one 1 mg tablet twice daily. 53 tablet 0   No current  facility-administered medications for this visit.     Allergies: No Known Allergies  Past Surgical History:  Procedure Laterality Date  . CHOLECYSTECTOMY    . LAPAROSCOPIC TUBAL LIGATION Bilateral 07/08/2013   Procedure: LAPAROSCOPIC TUBAL LIGATION with Fil[place] clips;  Surgeon: Turner Danielsavid C Lowe, MD;  Location: [facility];  Service: Gynecology;  Laterality: Bilateral;  . WISDOM TOOTH EXTRACTION     general anesthesia    Social History   Socioeconomic History  . Marital status: Single    Spouse name: Not on file  . Number of children: 3  . Years of education: 5016  . Highest education level: Not on file  Occupational History  . Occupation: Research officer, political partyrder processor    Employer: [employer]  Social Needs  . Financial resource strain: Not on file  . Food insecurity:    Worry: Not on file    Inability: Not on file  . Transportation needs:    Medical: Not on file    Non-medical: Not on file  Tobacco Use  . Smoking status: Current Some Day Smoker    Packs/day: 0.25    Years: 26.00    Pack years: 6.50    Types: Cigarettes  . Smokeless tobacco: Never Used  Substance and Sexual  Activity  . Alcohol use: Yes    Alcohol/week: 0.0 oz    Comment: occasional  . Drug use: No  . Sexual activity: Yes    Birth control/protection: Surgical  Lifestyle  . Physical activity:    Days per week: Not on file    Minutes per session: Not on file  . Stress: Not on file  Relationships  . Social connections:    Talks on phone: Not on file    Gets together: Not on file    Attends religious service: Not on file    Active member of club or organization: Not on file    Attends meetings of clubs or organizations: Not on file    Relationship status: Not on file  Other Topics Concern  . Not on file  Social History Narrative   She does try to exercise regularly doing cardio and weights for maybe 30 minutes at a time 3 days a week.    Family History  Problem Relation Age of Onset  . Cancer Mother 54  .  Alcohol abuse Father 78       Died of ETOH complications  . Cancer Maternal Grandfather 65     ROS Review of Systems See HPI Constitution: No fevers or chills No malaise No diaphoresis Skin: No rash or itching Eyes: no blurry vision, no double vision GU: no dysuria or hematuria Neuro: no dizziness or headaches * all others reviewed and negative   Objective: Vitals:   10/19/17 1114  BP: 116/72  Pulse: 88  Temp: 98.7 F (37.1 C)  TempSrc: Oral  SpO2: 99%  Weight: 254 lb 8 oz (115.4 kg)  Height: 5\' 6"  (1.676 m)    Physical Exam Physical Exam  Constitutional: She is oriented to person, place, and time. She appears well-developed and well-nourished.  HENT:  Head: Normocephalic and atraumatic.  Eyes: Conjunctivae and EOM are normal.  Cardiovascular: Normal rate, regular rhythm and normal heart sounds.   Pulmonary/Chest: Effort normal and breath sounds normal. No respiratory distress. She has no wheezes.  Abdominal: Normal appearance and bowel sounds are normal. There is no tenderness. There is no CVA tenderness.  Neurological: She is alert and oriented to person, place, and time.    Assessment and Plan Latoya Brooks was seen today for follow-up.  Diagnoses and all orders for this visit:  Morbid obesity (HCC)- discussed weight loss strategies which can cause GERD -     POCT glycosylated hemoglobin (Hb A1C)  Screening for diabetes mellitus- no diabetes or prediabetes  -     POCT glycosylated hemoglobin (Hb A1C)  Tobacco use disorder-  Discussed otpions including chantix  Gastroesophageal reflux disease without esophagitis-  Advised smoking cessation     Chanse Kagel A Jama Krichbaum

## 2017-11-24 ENCOUNTER — Encounter: Payer: Self-pay | Admitting: Family Medicine

## 2018-04-14 ENCOUNTER — Other Ambulatory Visit: Payer: Self-pay | Admitting: Family Medicine

## 2018-04-14 DIAGNOSIS — K219 Gastro-esophageal reflux disease without esophagitis: Secondary | ICD-10-CM

## 2018-06-07 IMAGING — DX DG LUMBAR SPINE COMPLETE 4+V
5 series · 5 of 5 positions shown · non-contrast
Comparison: None.

CLINICAL DATA: Low back pain

EXAM:
LUMBAR SPINE - COMPLETE 4+ VIEW

[l-spine ap]
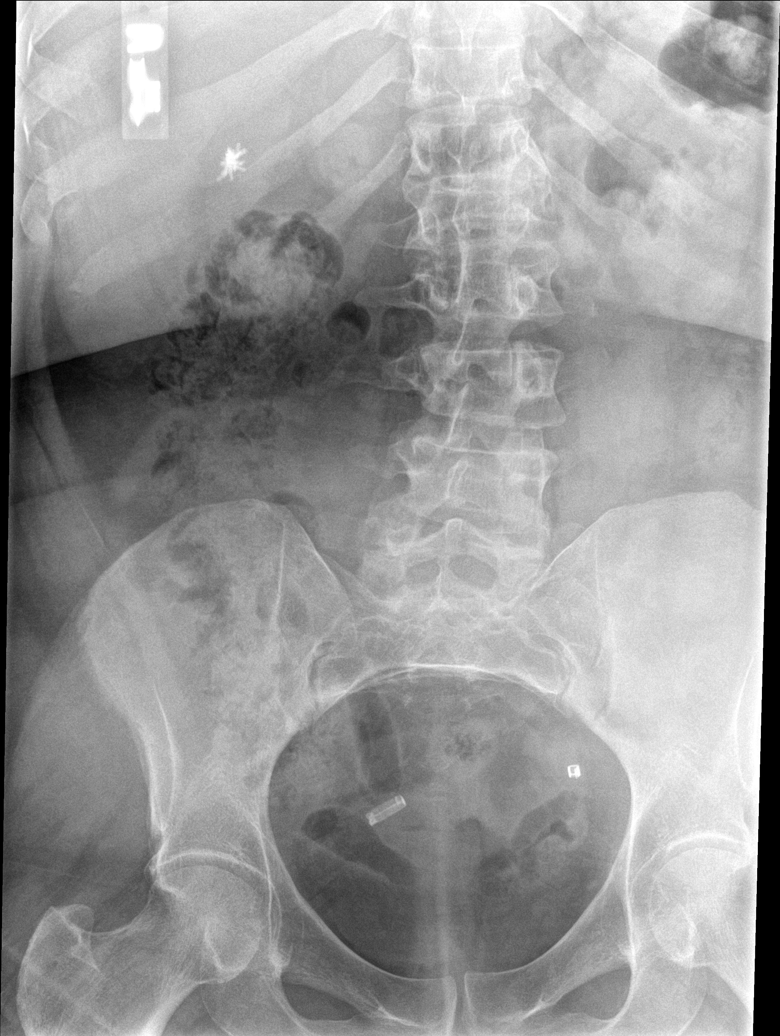

[l-spine obl (1 of 2)]
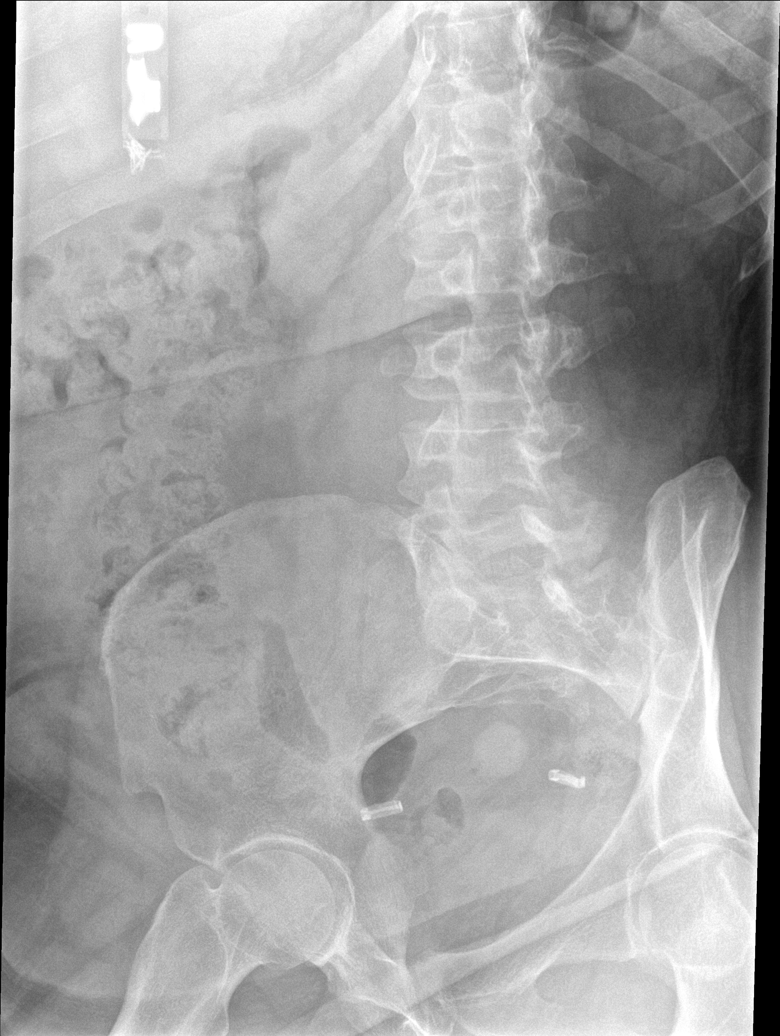

[l-spine obl (2 of 2)]
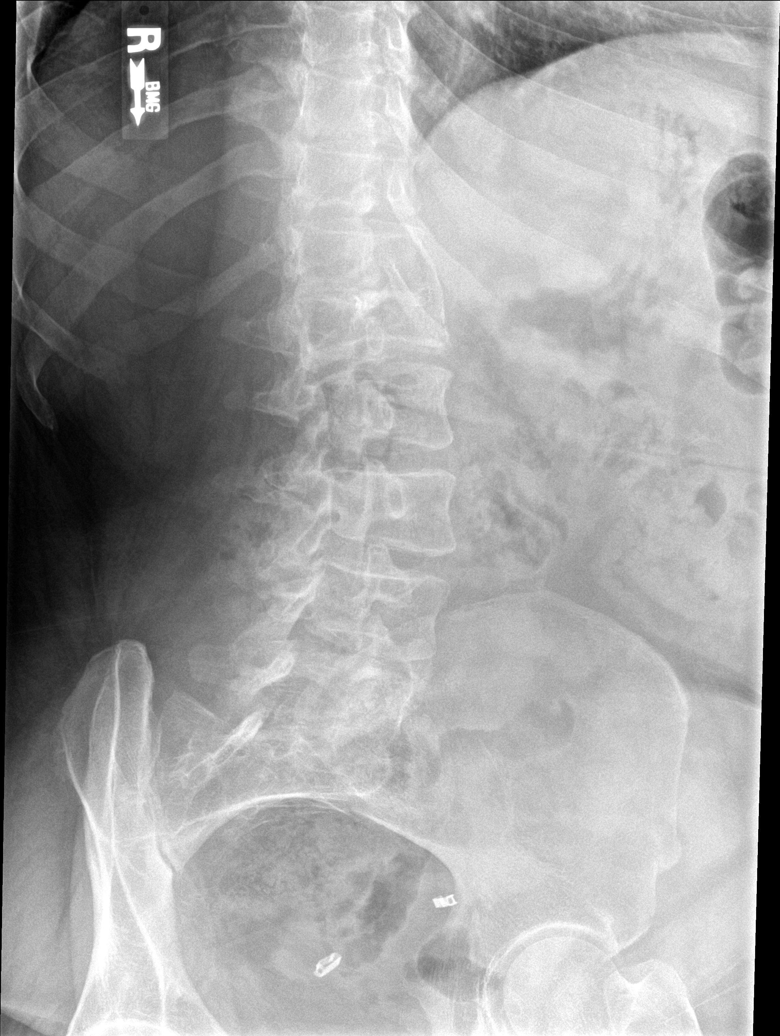

[l-spine lat]
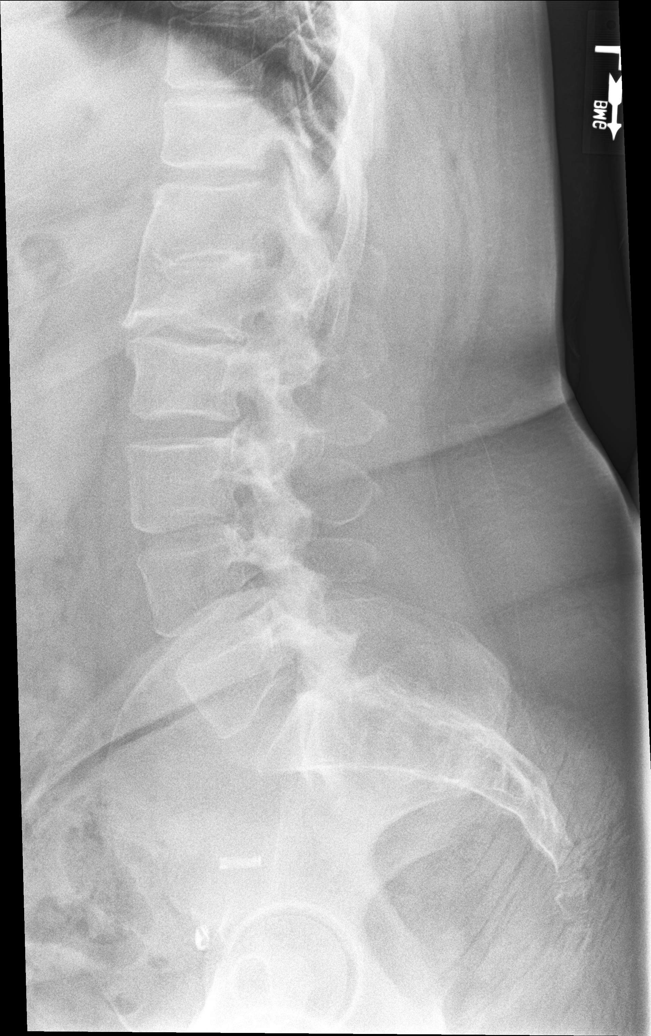

[l-spine l5-s1]
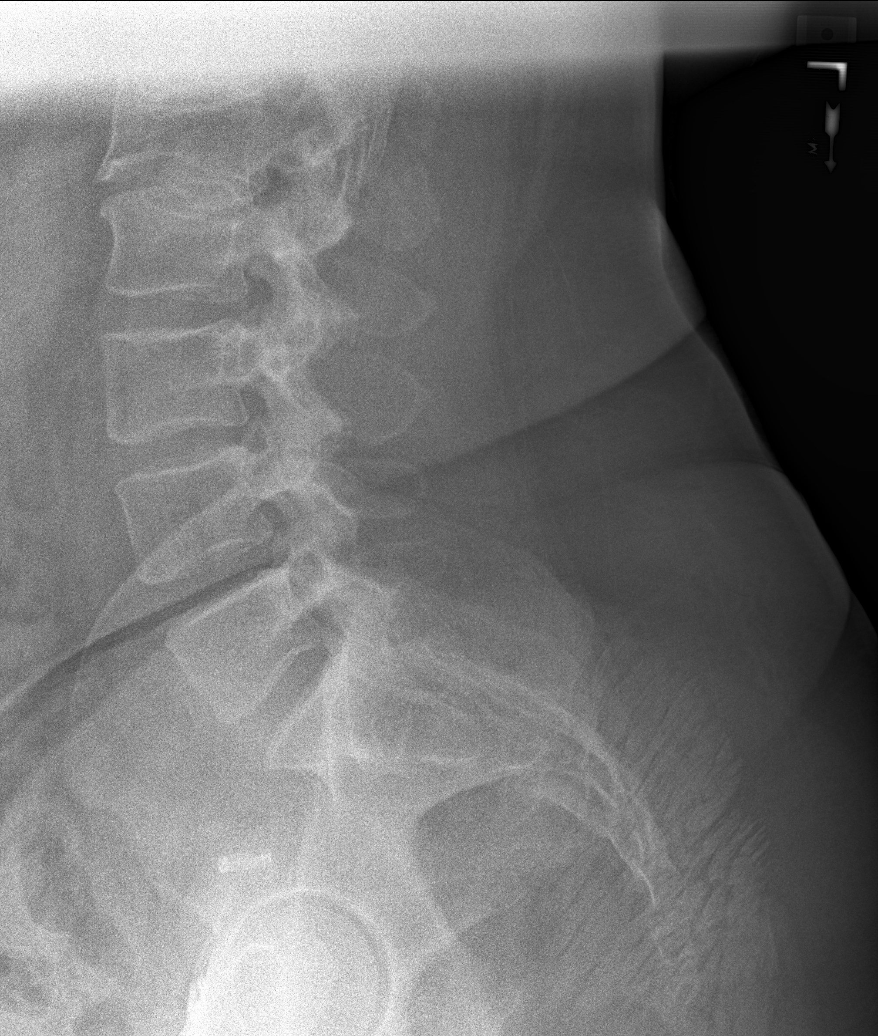

[5 of 5 positions shown; findings below may reference images not displayed]

FINDINGS: Partial fusion at T12-L1. Degenerative disc disease at L1-2. Normal
alignment. No acute fracture. No subluxation. SI joints are
symmetric and unremarkable. Bilateral tubal ligation clips noted.
IMPRESSION: No acute bony abnormality.

## 2018-10-26 ENCOUNTER — Other Ambulatory Visit: Payer: Self-pay | Admitting: Family Medicine

## 2018-10-26 DIAGNOSIS — K219 Gastro-esophageal reflux disease without esophagitis: Secondary | ICD-10-CM
